# Patient Record
Sex: Male | Born: 1990 | Race: White | Hispanic: No | Marital: Single | Smoking: Never smoker
Health system: Southern US, Community
[De-identification: ages and names within clinical notes are randomized; demographics above are authoritative.]

## PROBLEM LIST (undated history)

## (undated) DIAGNOSIS — K219 Gastro-esophageal reflux disease without esophagitis: Secondary | ICD-10-CM

## (undated) DIAGNOSIS — T7840XA Allergy, unspecified, initial encounter: Secondary | ICD-10-CM

## (undated) HISTORY — DX: Gastro-esophageal reflux disease without esophagitis: K21.9

## (undated) HISTORY — PX: LAPAROSCOPIC ENDO-RECTAL PULL THROUGH FOR HIRSCHSPRUNG'S DISEASE: SHX1923

## (undated) HISTORY — DX: Allergy, unspecified, initial encounter: T78.40XA

---
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
## 2015-07-16 ENCOUNTER — Emergency Department: Payer: BLUE CROSS/BLUE SHIELD

## 2015-07-16 ENCOUNTER — Encounter: Payer: Self-pay | Admitting: Emergency Medicine

## 2015-07-16 ENCOUNTER — Emergency Department
Admission: EM | Admit: 2015-07-16 | Discharge: 2015-07-16 | Disposition: A | Discharge status: Home / Self Care | Payer: BLUE CROSS/BLUE SHIELD | Attending: Emergency Medicine | Admitting: Emergency Medicine

## 2015-07-16 DIAGNOSIS — S0990XA Unspecified injury of head, initial encounter: Secondary | ICD-10-CM | POA: Insufficient documentation

## 2015-07-16 DIAGNOSIS — Y9389 Activity, other specified: Secondary | ICD-10-CM | POA: Insufficient documentation

## 2015-07-16 DIAGNOSIS — Y9289 Other specified places as the place of occurrence of the external cause: Secondary | ICD-10-CM | POA: Insufficient documentation

## 2015-07-16 DIAGNOSIS — S01112A Laceration without foreign body of left eyelid and periocular area, initial encounter: Secondary | ICD-10-CM | POA: Diagnosis not present

## 2015-07-16 DIAGNOSIS — S022XXA Fracture of nasal bones, initial encounter for closed fracture: Secondary | ICD-10-CM | POA: Insufficient documentation

## 2015-07-16 DIAGNOSIS — S0993XA Unspecified injury of face, initial encounter: Secondary | ICD-10-CM | POA: Diagnosis present

## 2015-07-16 DIAGNOSIS — W108XXA Fall (on) (from) other stairs and steps, initial encounter: Secondary | ICD-10-CM | POA: Insufficient documentation

## 2015-07-16 DIAGNOSIS — S01111A Laceration without foreign body of right eyelid and periocular area, initial encounter: Secondary | ICD-10-CM | POA: Diagnosis not present

## 2015-07-16 DIAGNOSIS — Y998 Other external cause status: Secondary | ICD-10-CM | POA: Diagnosis not present

## 2015-07-16 MED ORDER — LIDOCAINE-EPINEPHRINE (PF) 1 %-1:200000 IJ SOLN
INTRAMUSCULAR | Status: AC
  Filled 2015-07-16: qty 30

## 2015-07-16 MED ORDER — BACITRACIN ZINC 500 UNIT/GM EX OINT
TOPICAL_OINTMENT | CUTANEOUS | Status: AC
  Administered 2015-07-16: 1 via TOPICAL
  Filled 2015-07-16: qty 0.9

## 2015-07-16 MED ORDER — LIDOCAINE-EPINEPHRINE 2 %-1:100000 IJ SOLN
30.0000 mL | Freq: Once | INTRAMUSCULAR | Status: DC
  Filled 2015-07-16: qty 30

## 2015-07-16 MED ORDER — BACITRACIN ZINC 500 UNIT/GM EX OINT
TOPICAL_OINTMENT | Freq: Once | CUTANEOUS | Status: AC
  Administered 2015-07-16: 1 via TOPICAL

## 2015-07-16 NOTE — ED Notes (Signed)
Pt. Returned from CT.

## 2015-07-16 NOTE — ED Notes (Signed)
Pt. Going home with mother. 

## 2015-07-16 NOTE — Discharge Instructions (Signed)
Concussion, Adult  A concussion, or closed-head injury, is a brain injury caused by a direct blow to the head or by a quick and sudden movement (jolt) of the head or neck. Concussions are usually not life-threatening. Even so, the effects of a concussion can be serious. If you have had a concussion before, you are more likely to experience concussion-like symptoms after a direct blow to the head.   CAUSES  · Direct blow to the head, such as from running into another player during a soccer game, being hit in a fight, or hitting your head on a hard surface.  · A jolt of the head or neck that causes the brain to move back and forth inside the skull, such as in a car crash.  SIGNS AND SYMPTOMS  The signs of a concussion can be hard to notice. Early on, they may be missed by you, family members, and health care providers. You may look fine but act or feel differently.  Symptoms are usually temporary, but they may last for days, weeks, or even longer. Some symptoms may appear right away while others may not show up for hours or days. Every head injury is different. Symptoms include:  · Mild to moderate headaches that will not go away.  · A feeling of pressure inside your head.  · Having more trouble than usual:    Learning or remembering things you have heard.    Answering questions.    Paying attention or concentrating.    Organizing daily tasks.    Making decisions and solving problems.  · Slowness in thinking, acting or reacting, speaking, or reading.  · Getting lost or being easily confused.  · Feeling tired all the time or lacking energy (fatigued).  · Feeling drowsy.  · Sleep disturbances.    Sleeping more than usual.    Sleeping less than usual.    Trouble falling asleep.    Trouble sleeping (insomnia).  · Loss of balance or feeling lightheaded or dizzy.  · Nausea or vomiting.  · Numbness or tingling.  · Increased sensitivity to:    Sounds.    Lights.    Distractions.  · Vision problems or eyes that tire  easily.  · Diminished sense of taste or smell.  · Ringing in the ears.  · Mood changes such as feeling sad or anxious.  · Becoming easily irritated or angry for little or no reason.  · Lack of motivation.  · Seeing or hearing things other people do not see or hear (hallucinations).  DIAGNOSIS  Your health care provider can usually diagnose a concussion based on a description of your injury and symptoms. He or she will ask whether you passed out (lost consciousness) and whether you are having trouble remembering events that happened right before and during your injury.  Your evaluation might include:  · A brain scan to look for signs of injury to the brain. Even if the test shows no injury, you may still have a concussion.  · Blood tests to be sure other problems are not present.  TREATMENT  · Concussions are usually treated in an emergency department, in urgent care, or at a clinic. You may need to stay in the hospital overnight for further treatment.  · Tell your health care provider if you are taking any medicines, including prescription medicines, over-the-counter medicines, and natural remedies. Some medicines, such as blood thinners (anticoagulants) and aspirin, may increase the chance of complications. Also tell your health care   provider whether you have had alcohol or are taking illegal drugs. This information may affect treatment.  · Your health care provider will send you home with important instructions to follow.  · How fast you will recover from a concussion depends on many factors. These factors include how severe your concussion is, what part of your brain was injured, your age, and how healthy you were before the concussion.  · Most people with mild injuries recover fully. Recovery can take time. In general, recovery is slower in older persons. Also, persons who have had a concussion in the past or have other medical problems may find that it takes longer to recover from their current injury.  HOME  CARE INSTRUCTIONS  General Instructions  · Carefully follow the directions your health care provider gave you.  · Only take over-the-counter or prescription medicines for pain, discomfort, or fever as directed by your health care provider.  · Take only those medicines that your health care provider has approved.  · Do not drink alcohol until your health care provider says you are well enough to do so. Alcohol and certain other drugs may slow your recovery and can put you at risk of further injury.  · If it is harder than usual to remember things, write them down.  · If you are easily distracted, try to do one thing at a time. For example, do not try to watch TV while fixing dinner.  · Talk with family members or close friends when making important decisions.  · Keep all follow-up appointments. Repeated evaluation of your symptoms is recommended for your recovery.  · Watch your symptoms and tell others to do the same. Complications sometimes occur after a concussion. Older adults with a brain injury may have a higher risk of serious complications, such as a blood clot on the brain.  · Tell your teachers, school nurse, school counselor, coach, athletic trainer, or work manager about your injury, symptoms, and restrictions. Tell them about what you can or cannot do. They should watch for:    Increased problems with attention or concentration.    Increased difficulty remembering or learning new information.    Increased time needed to complete tasks or assignments.    Increased irritability or decreased ability to cope with stress.    Increased symptoms.  · Rest. Rest helps the brain to heal. Make sure you:    Get plenty of sleep at night. Avoid staying up late at night.    Keep the same bedtime hours on weekends and weekdays.    Rest during the day. Take daytime naps or rest breaks when you feel tired.  · Limit activities that require a lot of thought or concentration. These include:    Doing homework or job-related  work.    Watching TV.    Working on the computer.  · Avoid any situation where there is potential for another head injury (football, hockey, soccer, basketball, martial arts, downhill snow sports and horseback riding). Your condition will get worse every time you experience a concussion. You should avoid these activities until you are evaluated by the appropriate follow-up health care providers.  Returning To Your Regular Activities  You will need to return to your normal activities slowly, not all at once. You must give your body and brain enough time for recovery.  · Do not return to sports or other athletic activities until your health care provider tells you it is safe to do so.  · Ask   your health care provider when you can drive, ride a bicycle, or operate heavy machinery. Your ability to react may be slower after a brain injury. Never do these activities if you are dizzy.  · Ask your health care provider about when you can return to work or school.  Preventing Another Concussion  It is very important to avoid another brain injury, especially before you have recovered. In rare cases, another injury can lead to permanent brain damage, brain swelling, or death. The risk of this is greatest during the first 7-10 days after a head injury. Avoid injuries by:  · Wearing a seat belt when riding in a car.  · Drinking alcohol only in moderation.  · Wearing a helmet when biking, skiing, skateboarding, skating, or doing similar activities.  · Avoiding activities that could lead to a second concussion, such as contact or recreational sports, until your health care provider says it is okay.  · Taking safety measures in your home.    Remove clutter and tripping hazards from floors and stairways.    Use grab bars in bathrooms and handrails by stairs.    Place non-slip mats on floors and in bathtubs.    Improve lighting in dim areas.  SEEK MEDICAL CARE IF:  · You have increased problems paying attention or  concentrating.  · You have increased difficulty remembering or learning new information.  · You need more time to complete tasks or assignments than before.  · You have increased irritability or decreased ability to cope with stress.  · You have more symptoms than before.  Seek medical care if you have any of the following symptoms for more than 2 weeks after your injury:  · Lasting (chronic) headaches.  · Dizziness or balance problems.  · Nausea.  · Vision problems.  · Increased sensitivity to noise or light.  · Depression or mood swings.  · Anxiety or irritability.  · Memory problems.  · Difficulty concentrating or paying attention.  · Sleep problems.  · Feeling tired all the time.  SEEK IMMEDIATE MEDICAL CARE IF:  · You have severe or worsening headaches. These may be a sign of a blood clot in the brain.  · You have weakness (even if only in one hand, leg, or part of the face).  · You have numbness.  · You have decreased coordination.  · You vomit repeatedly.  · You have increased sleepiness.  · One pupil is larger than the other.  · You have convulsions.  · You have slurred speech.  · You have increased confusion. This may be a sign of a blood clot in the brain.  · You have increased restlessness, agitation, or irritability.  · You are unable to recognize people or places.  · You have neck pain.  · It is difficult to wake you up.  · You have unusual behavior changes.  · You lose consciousness.  MAKE SURE YOU:  · Understand these instructions.  · Will watch your condition.  · Will get help right away if you are not doing well or get worse.     This information is not intended to replace advice given to you by your health care provider. Make sure you discuss any questions you have with your health care provider.     Document Released: 08/17/2003 Document Revised: 06/17/2014 Document Reviewed: 12/17/2012  Elsevier Interactive Patient Education ©2016 [employer]

## 2015-07-16 NOTE — ED Notes (Signed)
Pt. States he fell going up 3 stairs.  Pt. States he has had some alcohol tonight.  Pt. Has 3 cm laceration above lt. Eye and a 2 cm laceration below lt. Eye.  Pt. Denies LOC.

## 2015-07-16 NOTE — ED Notes (Signed)
Pt says he tripped on the steps and fell down 3 stairs; not sure what he struck his head/face on; pt with laceration above left eye and laceration below left eye; swelling and bruising around left eye; pt admits to drinking about 6 beers tonight; pt awake and alert in triage; denies loss of consciousness

## 2015-07-16 NOTE — ED Notes (Signed)
Cleaned pt. Face with surgical scrub.

## 2015-07-16 NOTE — ED Provider Notes (Signed)
Time Seen: Approximately 320  I have reviewed the triage notes  Chief Complaint: Fall; Laceration; and Facial Injury   History of Present Illness: Chris Mcbride is a 25 y.o. male *who apparently tripped down some steps and admits to drinking several beers this evening. Patient struck his head and his face on the steps. There was no loss of consciousness before or after his fall. He denies any visual disturbances. He has a laceration at the left eyebrow and also underneath his left eye. Denies any neck thoracic or lumbar spine pain. He states his last tetanus shot was approximately 2 years ago.   History reviewed. No pertinent past medical history.  There are no active problems to display for this patient.   History reviewed. No pertinent past surgical history.  History reviewed. No pertinent past surgical history.  No current outpatient prescriptions on file.  Allergies:  Review of patient's allergies indicates no known allergies.  Family History: History reviewed. No pertinent family history.  Social History: Social History  Substance Use Topics  . Smoking status: Never Smoker   . Smokeless tobacco: None  . Alcohol Use: Yes     Review of Systems:   Constitutional: No fever Eyes: No visual disturbances ENT: No sore throat, ear pain Cardiac: No chest pain Respiratory: No shortness of breath, wheezing, or stridor Abdomen: No abdominal pain, no vomiting, No diarrhea Neurologic: No focal weakness, trouble with speech or swollowing He denies any extremity trauma Physical Exam:  ED Triage Vitals  Enc Vitals Group     BP 07/16/15 0300 134/74 mmHg     Pulse Rate 07/16/15 0300 119     Resp 07/16/15 0300 18     Temp 07/16/15 0300 97.6 F (36.4 C)     Temp Source 07/16/15 0300 Oral     SpO2 07/16/15 0300 97 %     Weight 07/16/15 0300 175 lb (79.379 kg)     Height 07/16/15 0300  (1.727 m)     Head Cir --      Peak Flow --      Pain Score 07/16/15 0302 2      Pain Loc --      Pain Edu? --      Excl. in GC? --     General: Awake , Alert , and Oriented times 3; GCS 15 Head: Normal cephalic , atraumatic midface is stable. He says the area of bruising left medial canthus area with a laceration right eyebrow and a laceration underneath the left eye. Eyes: Pupils equal , round, reactive to light the vision remained stable with all extraocular eye movements. No papilledema Nose/Throat: No nasal drainage, patent upper airway without erythema or exudate. Bruising at the nasal bridge. No septal hematoma Neck: Supple, Full range of motion, No anterior adenopathy or palpable thyroid masses Lungs: Clear to ascultation without wheezes , rhonchi, or rales Heart: Regular rate, regular rhythm without murmurs , gallops , or rubs Abdomen: Soft, non tender without rebound, guarding , or rigidity; bowel sounds positive and symmetric in all 4 quadrants. No organomegaly .        Extremities: 2 plus symmetric pulses. No edema, clubbing or cyanosis Neurologic: normal ambulation, Motor symmetric without deficits, sensory intact Skin: warm, dry, no rashes   Labs:   All laboratory work was reviewed including any pertinent negatives or positives listed below:  Labs Reviewed - No data to display   Radiology:    CLINICAL DATA: Tripped and fell down 3  steps, struck head and face. LEFT periorbital laceration.  EXAM: CT HEAD WITHOUT CONTRAST  CT MAXILLOFACIAL WITHOUT CONTRAST  TECHNIQUE: Multidetector CT imaging of the head and maxillofacial structures were performed using the standard protocol without intravenous contrast. Multiplanar CT image reconstructions of the maxillofacial structures were also generated.  COMPARISON: None.  FINDINGS: CT HEAD FINDINGS  The ventricles and sulci are normal. No intraparenchymal hemorrhage, mass effect nor midline shift. No acute large vascular territory infarcts. No abnormal extra-axial fluid collections. Basal  cisterns are patent. No skull fracture.  CT MAXILLOFACIAL FINDINGS  Comminuted bilateral nasal bone fractures, extending to the nasal process of the LEFT maxilla. Fracture fragments are mildly displaced to the RIGHT. Minimally displaced osseous nasal septum fracture, septum is deviated to the RIGHT. Mandible is intact, condyles are located and there are no additional facial fractures.  Small amount of LEFT maxillary hemo sinus. Mild paranasal sinus mucosal thickening. Imaged mastoid air cells are well aerated. No destructive bony lesions.  Ocular globes and orbital contents are normal. Frontal scalp hematoma, subcutaneous gas with midface soft tissue swelling extending to the LEFT pre malar soft tissues. No radiopaque foreign bodies.  IMPRESSION: CT HEAD: Normal noncontrast CT head.  CT MAXILLOFACIAL: Acute bilateral mildly displaced nasal bone fractures. Mildly displaced osseous nasal septum.  Frontal and midface soft tissue swelling without postseptal hematoma.   I personally reviewed the radiologic studies   Procedures:  LACERATION REPAIR Performed by: Jennye Moccasin Authorized by: Jennye Moccasin Consent: Verbal consent obtained. Risks and benefits: risks, benefits and alternatives were discussed Consent given by: patient Patient identity confirmed: provided demographic data Prepped and Draped in normal sterile fashion Wound explored  Laceration Location: Left eyebrow  Laceration Length: 3*cm  No Foreign Bodies seen or palpated  Anesthesia: local infiltration  Local anesthetic: lidocaine 1 % with epinephrine  Anesthetic total: 3 ml  Irrigation method: syringe Amount of cleaning: standard  Skin closure: Single-layer non-complicated   Number of sutures: 3 6-0 Prolene   Technique: *Interrupted   Patient tolerance: Patient tolerated the procedure well with no immediate complications.   C ED Course: * Patient's stay here was uneventful. His family  is here by the bedside and explained that he'll need ice over the next 48 hours to decrease the swelling. I also referred him to an ENT physician for his nasal fracture. Suture removal should be in 7 days. He has a small laceration below the eye 1 cm non-complicated just through the skin and was closed with Steri-Strip. He's been advised drink plenty of fluids and return here if he has any new concerns. They were given instructions on concussions, nasal fracture, and laceration repair instructions   Assessment: * Acute closed head injury Nasal fracture 3 cm single layer closure without complication      Plan: Patient was advised to return immediately if condition worsens. Patient was advised to follow up with their primary care physician or other specialized physicians involved in their outpatient care             Jennye Moccasin, MD 07/16/15 [phone]

## 2015-07-16 NOTE — ED Notes (Signed)
Patient transported to CT 

## 2016-10-30 IMAGING — CT CT MAXILLOFACIAL W/O CM
4 series · 16 of 47 positions shown, 18 images · non-contrast
Comparison: None.

CLINICAL DATA: Tripped and fell down 3 steps, struck head and face.
LEFT periorbital laceration.

EXAM:
CT HEAD WITHOUT CONTRAST
CT MAXILLOFACIAL WITHOUT CONTRAST
TECHNIQUE: Multidetector CT imaging of the head and maxillofacial structures
were performed using the standard protocol without intravenous
contrast. Multiplanar CT image reconstructions of the maxillofacial
structures were also generated.

[Series 2: head wo · axial · 0.47mm/px · z∈[-116,-21]mm · 6 of 30 slices shown, 8 images]
[im 5/30  brain]
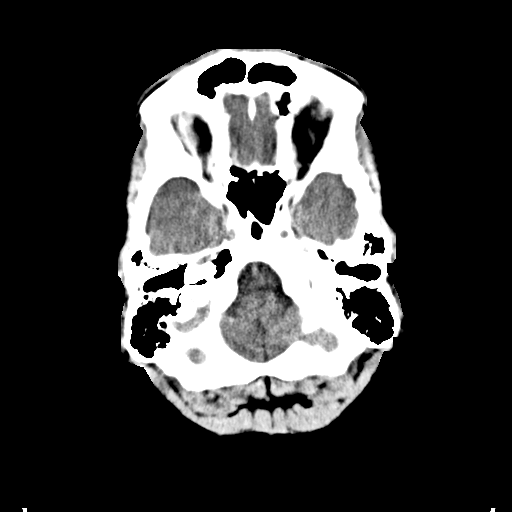
[im 5/30  bone]
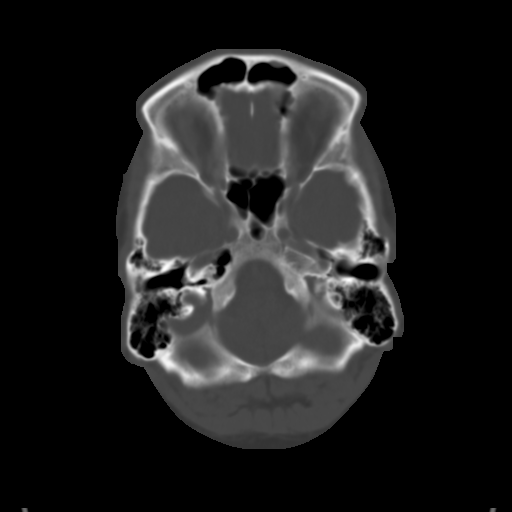
[im 9/30  bone]
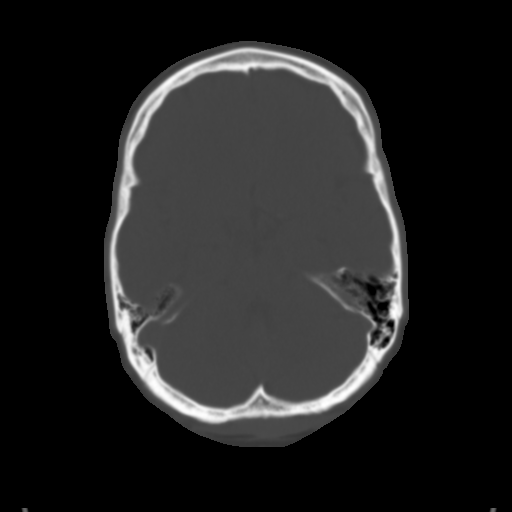
[im 13/30  bone]
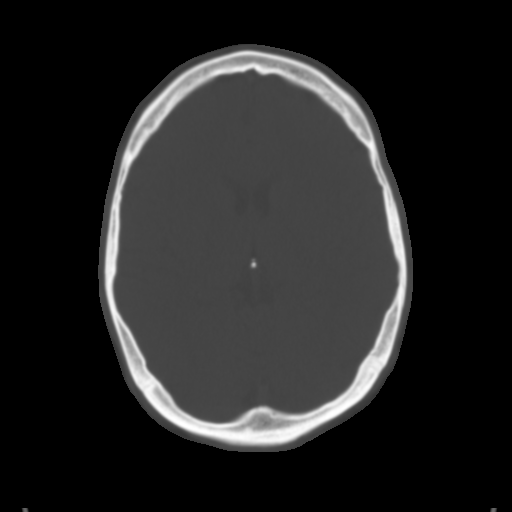
[im 17/30  bone]
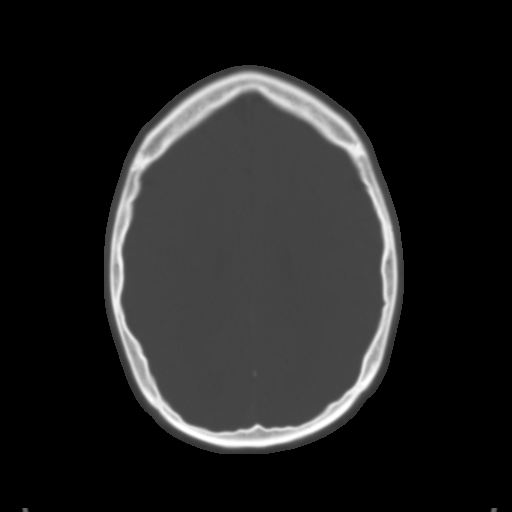
[im 21/30  brain]
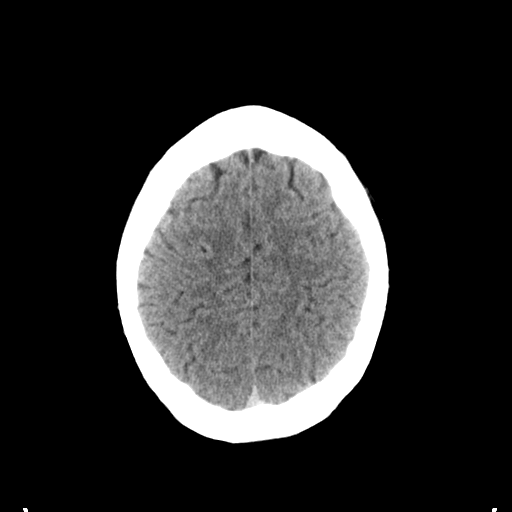
[im 21/30  bone]
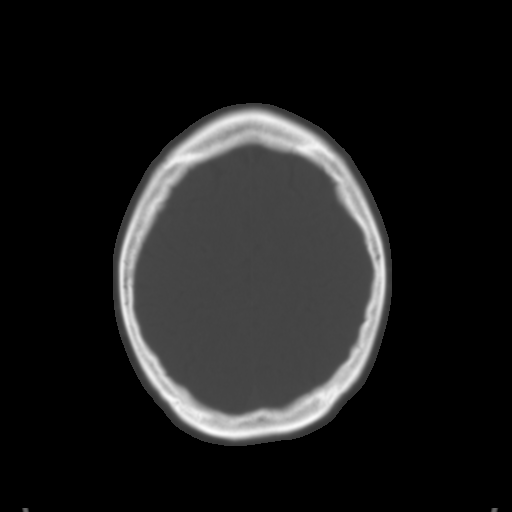
[im 25/30  bone]
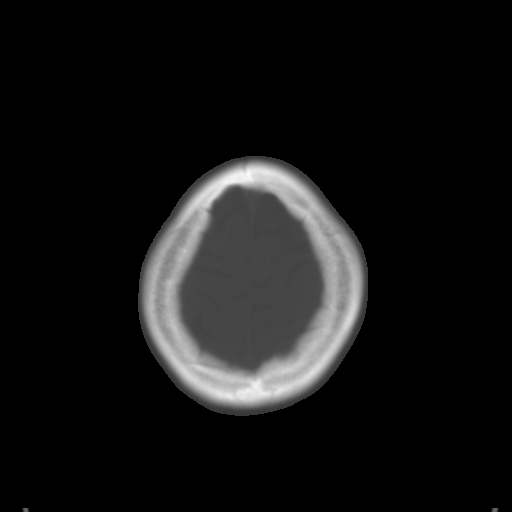

[Series 3: max soft · axial · 0.31mm/px · z∈[-242,-190]mm · 4 of 79 slices shown]
[im 8/79  brain]
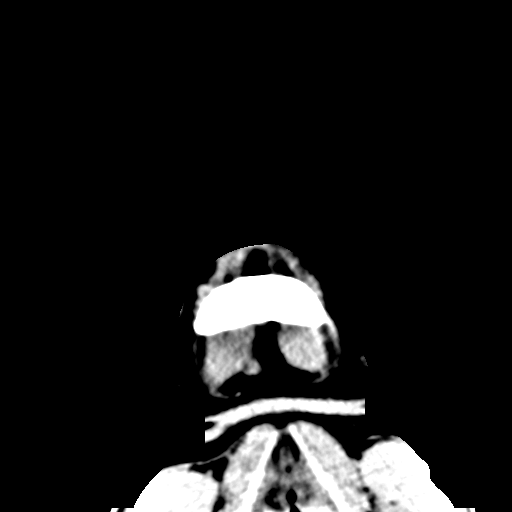
[im 15/79  brain]
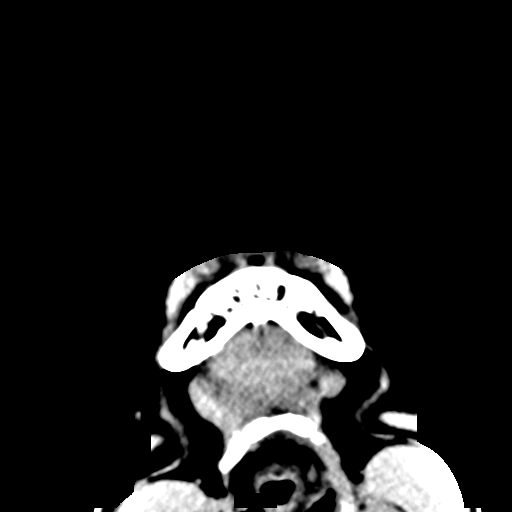
[im 27/79  brain]
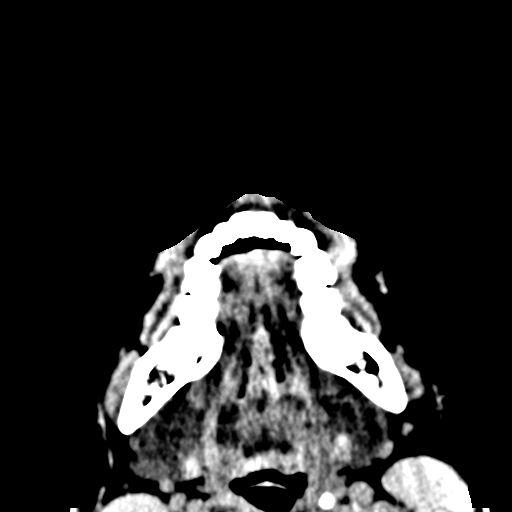
[im 34/79  brain]
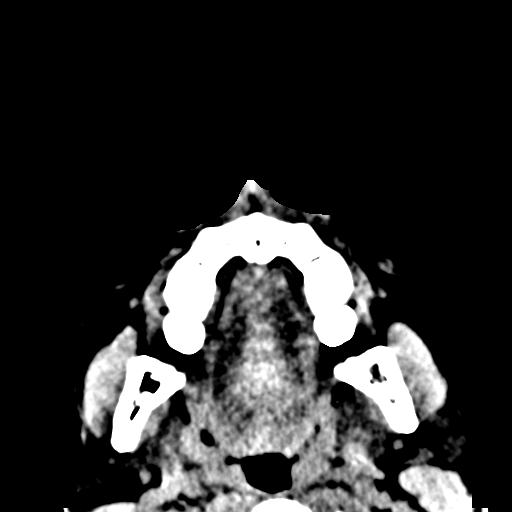

[Series 6: coronal soft · coronal · 0.32mm/px · 3 of 68 slices shown]
[im 23/68  bone]
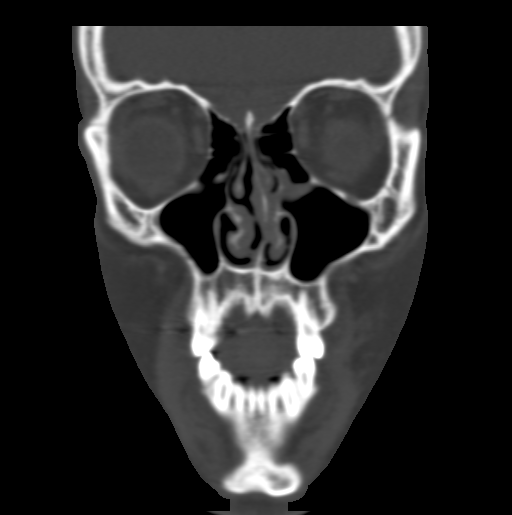
[im 30/68  bone]
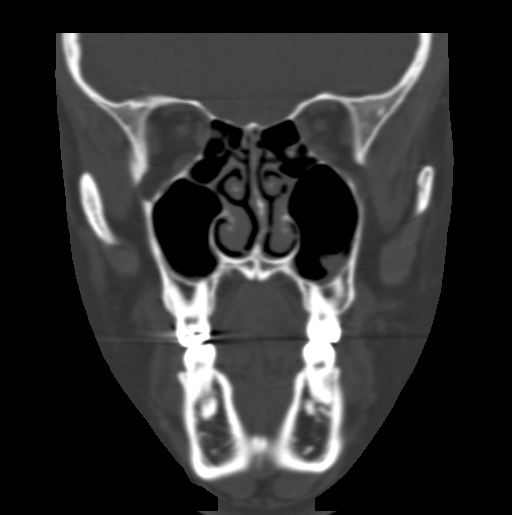
[im 38/68  bone]
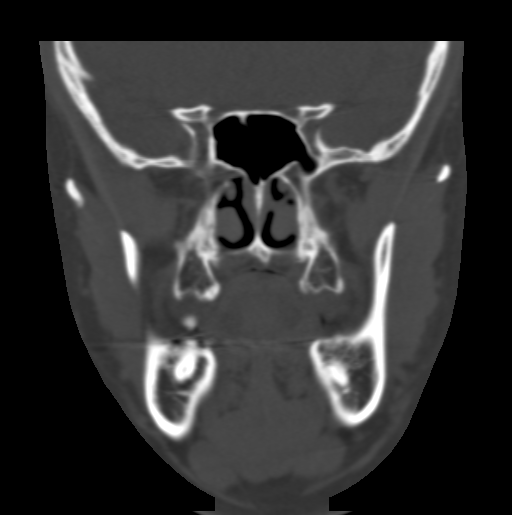

[Series 7: sagittal soft · sagittal · 0.32mm/px · 3 of 77 slices shown]
[im 26/77  bone]
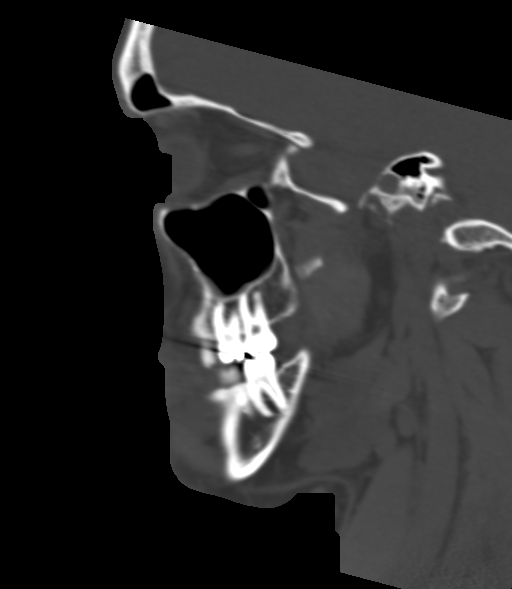
[im 39/77  bone]
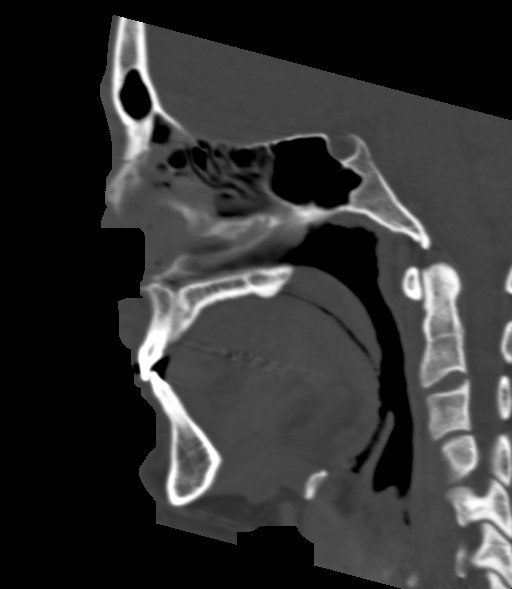
[im 51/77  bone]
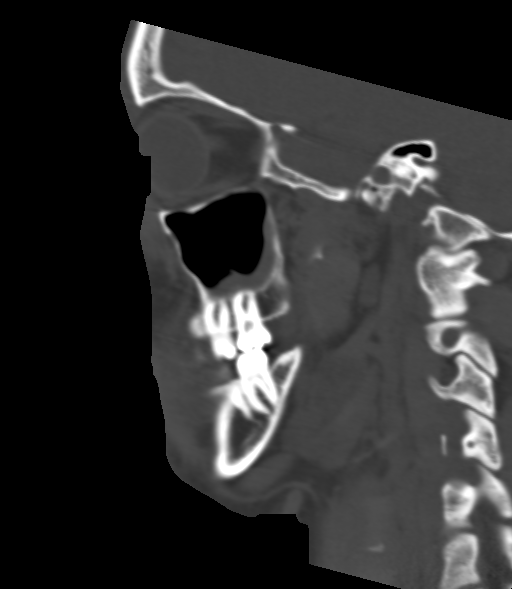

[16 of 47 positions shown; findings below may reference images not displayed]

FINDINGS: CT HEAD FINDINGS

The ventricles and sulci are normal. No intraparenchymal hemorrhage,
mass effect nor midline shift. No acute large vascular territory
infarcts. No abnormal extra-axial fluid collections. Basal cisterns
are patent. No skull fracture.

CT MAXILLOFACIAL FINDINGS

Comminuted bilateral nasal bone fractures, extending to the nasal
process of the LEFT maxilla. Fracture fragments are mildly displaced
to the RIGHT. Minimally displaced osseous nasal septum fracture,
septum is deviated to the RIGHT. Mandible is intact, condyles are
located and there are no additional facial fractures.

Small amount of LEFT maxillary hemo sinus. Mild paranasal sinus
mucosal thickening. Imaged mastoid air cells are well aerated. No
destructive bony lesions.

Ocular globes and orbital contents are normal. Frontal scalp
hematoma, subcutaneous gas with midface soft tissue swelling
extending to the LEFT pre malar soft tissues. No radiopaque foreign
bodies.
IMPRESSION: CT HEAD: Normal noncontrast CT head.

CT MAXILLOFACIAL: Acute bilateral mildly displaced nasal bone
fractures. Mildly displaced osseous nasal septum.

Frontal and midface soft tissue swelling without postseptal
hematoma.

## 2019-10-25 ENCOUNTER — Other Ambulatory Visit: Payer: Self-pay | Admitting: Family Medicine

## 2019-10-25 ENCOUNTER — Other Ambulatory Visit: Payer: Self-pay

## 2019-10-25 ENCOUNTER — Encounter: Payer: Self-pay | Admitting: Family Medicine

## 2019-10-25 ENCOUNTER — Ambulatory Visit (INDEPENDENT_AMBULATORY_CARE_PROVIDER_SITE_OTHER): Payer: BLUE CROSS/BLUE SHIELD | Admitting: Family Medicine

## 2019-10-25 VITALS — BP 105/68 | HR 52 | Temp 97.7°F | Resp 19 | Ht 68.0 in | Wt 150.0 lb

## 2019-10-25 DIAGNOSIS — R3915 Urgency of urination: Secondary | ICD-10-CM | POA: Insufficient documentation

## 2019-10-25 DIAGNOSIS — Z0000 Encounter for general adult medical examination without abnormal findings: Secondary | ICD-10-CM

## 2019-10-25 DIAGNOSIS — Z114 Encounter for screening for human immunodeficiency virus [HIV]: Secondary | ICD-10-CM

## 2019-10-25 DIAGNOSIS — R222 Localized swelling, mass and lump, trunk: Secondary | ICD-10-CM

## 2019-10-25 DIAGNOSIS — Z7689 Persons encountering health services in other specified circumstances: Secondary | ICD-10-CM | POA: Diagnosis not present

## 2019-10-25 DIAGNOSIS — J3089 Other allergic rhinitis: Secondary | ICD-10-CM | POA: Diagnosis not present

## 2019-10-25 DIAGNOSIS — J309 Allergic rhinitis, unspecified: Secondary | ICD-10-CM | POA: Insufficient documentation

## 2019-10-25 LAB — POCT URINALYSIS DIPSTICK
Bilirubin, UA: NEGATIVE
Blood, UA: NEGATIVE
Glucose, UA: NEGATIVE
Ketones, UA: NEGATIVE
Leukocytes, UA: NEGATIVE
Nitrite, UA: NEGATIVE
Protein, UA: NEGATIVE
Spec Grav, UA: 1.02 (ref 1.010–1.025)
Urobilinogen, UA: 0.2 E.U./dL
pH, UA: 5 (ref 5.0–8.0)

## 2019-10-25 NOTE — Patient Instructions (Addendum)
Thank you for coming to the office today.  Request records from [facility] and we can review them in the future.  Urinary Urgency - we will check urine test today and send out for culture. - Regardless, most likely we will be referring to a Urologist   Let me know which urologist you prefer - either our [facility] group in [facility] -1st floor [address] [place],  [place]  [address] Phone: [phone]  Or Dr Anola Gurney - at [facility]  -------------------------  For the bump/nodule on the chest - Most likely one of these options - Lipoma - Cyst - Lymph node  We can watch it for 1 more month, if still persistent or changing, then I would start with referral to General Surgery to evaluate / remove / and diagnose   DUE for FASTING BLOOD WORK (no food or drink after midnight before the lab appointment, only water or coffee without cream/sugar on the morning of)  SCHEDULE "Lab Only" visit in the morning at the clinic for lab draw in 4 WEEKS   - Make sure Lab Only appointment is at about 1 week before your next appointment, so that results will be available  For Lab Results, once available within 2-3 days of blood draw, you can can log in to MyChart online to view your results and a brief explanation. Also, we can discuss results at next follow-up visit.   Please schedule a Follow-up Appointment to: Return in about 4 weeks (around 11/22/2019) for Annual Physical.  If you have any other questions or concerns, please feel free to call the office or send a message through MyChart. You may also schedule an earlier appointment if necessary.  Additionally, you may be receiving a survey about your experience at our office within a few days to 1 week by e-mail or mail. We value your feedback.  Saralyn Pilar, DO [facility], [place]

## 2019-10-25 NOTE — Progress Notes (Signed)
Subjective:    Patient ID: Chris Mcbride, male    DOB: 1991-01-26, 29 y.o.   MRN: [id number]  Chris Mcbride is a 29 y.o. male presenting on 10/25/2019 for Establish Care, Urinary urge, and Nodule on chest   HPI   Here for establish care. He has 2 main concerns today see below He has prior records from PCP years ago, will get record He would like an annual physical in next few weeks or month with labs here to re-establish  Urinary Urge For past 1-2 months, has had increasing urinary urgency at times, rare leakage. Seems unpredictable, during day without having overnight episodes. 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 But he says the urge to urinate does not occur while waiting 3+hours to go urinate, it can be random. It can occur 1 hour after voiding or other times. - Admits occasional darker urine usually in morning only - Drinks a beer a day often after work - Occasionally 1 coffee per week. No sodas. Occasional Tea in evening. - Denies any hematuria, dysuria, pelvic or abdominal pain, foamy urine, foul odor  Nodule on Chest Wall Reports also for past 1-2 months approximately maybe longer, noticed a small nodule on left front of his chest near sternum. He said pushed on it and felt a bump, it was not sore. He has monitored it, and has not increased or decreased in size. It is mobile and he can feel it. No redness or rash or swelling or pain or itching. No rash on surface of skin. Denies any other swelling or nodules under arms or neck or other areas.  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 No further problem.  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 He has family history father with dx prostate cancer age 30-50  approximately, treated by Dr Evelene Croon, had additional specialized procedure, doing well.  Did not get a COVID19 vaccine   Depression screen [facility] 2/9 10/25/2019  Decreased Interest 0  Down, Depressed, Hopeless 0  PHQ - 2 Score 0    History reviewed. No pertinent past medical history. Past Surgical History:  Procedure Laterality Date  . LAPAROSCOPIC ENDO-RECTAL PULL THROUGH FOR HIRSCHSPRUNG'S DISEASE     Social History   Socioeconomic History  . Marital status: Single    Spouse name: Not on file  . Number of children: Not on file  . Years of education: [employer]  . Highest education level: High school graduate  Occupational History  . Occupation: Civil Service fast streamer  Tobacco Use  . Smoking status: Never Smoker  . Smokeless tobacco: Never Used  . Tobacco comment: Vape--from 3 years   Substance and Sexual Activity  . Alcohol use: Yes    Alcohol/week: 6.0 standard drinks    Types: 6 Cans of beer per week  . Drug use: Not Currently    Types: Marijuana  . Sexual activity: Not on file  Other Topics Concern  . Not on file  Social History Narrative  . Not on file   Social Determinants of Health   Financial Resource Strain:   . Difficulty of Paying Living Expenses:   Food Insecurity:   . Worried About Programme researcher, broadcasting/film/video in the Last Year:   . Barista in  the Last Year:   Transportation Needs:   . Film/video editor (Medical):   Marland Kitchen Lack of Transportation (Non-Medical):   Physical Activity:   . Days of Exercise per Week:   . Minutes of Exercise per Session:   Stress:   . Feeling of Stress :   Social Connections:   . Frequency of Communication with Friends and Family:   . Frequency of Social Gatherings with Friends and Family:   . Attends Religious Services:   . Active Member of Clubs or Organizations:   . Attends Archivist Meetings:   Marland Kitchen Marital Status:   Intimate Partner Violence:   . Fear of Current or Ex-Partner:   . Emotionally Abused:   Marland Kitchen  Physically Abused:   . Sexually Abused:    Family History  Problem Relation Age of Onset  . Cancer Mother        breast  . Breast cancer Mother   . Stroke Father   . Diabetes Father   . Cancer Father        prostate  . Prostate cancer Father   . Irregular heart beat Sister        heart monitor, palpitation, no clear diagnosis   Current Outpatient Medications on File Prior to Visit  Medication Sig  . loratadine (CLARITIN) 10 MG tablet Take 10 mg by mouth daily.   No current facility-administered medications on file prior to visit.    Review of Systems Per HPI unless specifically indicated above      Objective:    BP 105/68   Pulse (!) 52   Temp 97.7 F (36.5 C) (Temporal)   Resp 19   Ht 5\' 8"  (1.727 m)   Wt 150 lb (68 kg)   BMI 22.81 kg/m   Wt Readings from Last 3 Encounters:  10/25/19 150 lb (68 kg)  07/16/15 175 lb (79.4 kg)    Physical Exam Vitals and nursing note reviewed.  Constitutional:      General: He is not in acute distress.    Appearance: He is well-developed. He is not diaphoretic.     Comments: Well-appearing, comfortable, cooperative  HENT:     Head: Normocephalic and atraumatic.  Eyes:     General:        Right eye: No discharge.        Left eye: No discharge.     Conjunctiva/sclera: Conjunctivae normal.  Cardiovascular:     Rate and Rhythm: Normal rate and regular rhythm.     Pulses: Normal pulses.     Heart sounds: No murmur.  Pulmonary:     Effort: Pulmonary effort is normal.     Breath sounds: Normal breath sounds. No wheezing or rales.  Chest:     Chest wall: No tenderness.  Skin:    General: Skin is warm and dry.     Findings: No erythema or rash.     Comments: Left anterior chest wall mobile firm 1 cm lesion subcutaneous, non tender, no other lesion or erythema, near approximately 3rd to 4th ICS space area near sternum  Neurological:     Mental Status: He is alert and oriented to person, place, and time.  Psychiatric:         Behavior: Behavior normal.     Comments: Well groomed, good eye contact, normal speech and thoughts    Results for orders placed or performed in visit on 10/25/19  POCT urinalysis dipstick  Result Value Ref Range   Color, UA  Insurance risk surveyor, UA clear    Glucose, UA Negative Negative   Bilirubin, UA Negative    Ketones, UA Negative    Spec Grav, UA 1.020 1.010 - 1.025   Blood, UA Negative    pH, UA 5.0 5.0 - 8.0   Protein, UA Negative Negative   Urobilinogen, UA 0.2 0.2 or 1.0 E.U./dL   Nitrite, UA Negative    Leukocytes, UA Negative Negative   Appearance     Odor        Assessment & Plan:   Problem List Items Addressed This Visit    Urinary urgency   Relevant Orders   POCT urinalysis dipstick (Completed)   Urine Culture   Nodule of left anterior chest wall   Environmental and seasonal allergies   Allergic rhinitis due to allergen - Primary    Other Visit Diagnoses    Encounter to establish care with new doctor          Request [facility] from [facility] PCP > 6+ years ago  #Allergies, seasonal / environmental Chronic problems, mostly controlled on therapy Continue OTC anti histamine daily loratadine Order rx - Start nasal steroid Flonase 2 sprays in each nostril daily for 4-6 weeks, may repeat course seasonally or as needed  #urinary urgency Unusual symptom today, does not seem precisely related to a UTI, given lack of history supporting this, however we can investigate further to rule out this and other causes with UA Dipstick and Urine Culture today - Consider due to bladder irritants - but limited dietary history of this, still possible, also concern with driving job he doesn't often stop to urinate however he denies going long hours without voiding, so it seems less likely again there. - he does have fam history of prostate cancer in father at younger age 85-50 ish approximately  I advised him that I would recommend pursuing Urology referral for  consultation on his urgency symptom, especially if his urine testing here comes back negative and I do not have an obvious cause to explain it. He will consider Dr Alcide [facility] (provider that saw his Father) or through [facility] provider at [facility] - he will notify us to send referral when ready, or when we call him with these curernt results first within 48-72 hours.    #Nodule of chest wall, anterior L Uncertain clear diagnosis, onset 1-2 months ago but questionable could be longer Exam today is suggestive most of benign etiology mobile firm nodular density, based on location seems most likely cystic lesion vs lipoma but cannot rule out lymph node, no other areas of nodular density, negative neck and axillary clavicular regions. No other systemic symptoms concerning or other clues. - We agree to defer this for monitoring for 1 more month, if still present then at return, we can pursue with consultation from general surgery, may consider Chest X-ray or we can refer and they can evaluate it and consider excisional biopsy if indicated.  Orders Placed This Encounter  Procedures  . Urine Culture  . POCT urinalysis dipstick     No orders of the defined types were placed in this encounter.    Follow up plan: Return in about 4 weeks (around 11/22/2019) for Annual Physical.   Future labs ordered for 11/15/19  Saralyn Pilar, DO [facility] 10/25/2019, 11:13 AM

## 2019-10-26 LAB — URINE CULTURE
MICRO NUMBER:: 10486074
Result:: NO GROWTH
SPECIMEN QUALITY:: ADEQUATE

## 2019-11-15 ENCOUNTER — Other Ambulatory Visit: Payer: PRIVATE HEALTH INSURANCE

## 2019-11-15 ENCOUNTER — Other Ambulatory Visit: Payer: Self-pay

## 2019-11-15 DIAGNOSIS — Z0000 Encounter for general adult medical examination without abnormal findings: Secondary | ICD-10-CM

## 2019-11-15 DIAGNOSIS — Z114 Encounter for screening for human immunodeficiency virus [HIV]: Secondary | ICD-10-CM

## 2019-11-16 LAB — LIPID PANEL
Cholesterol: 183 mg/dL (ref <200)
HDL: 68 mg/dL (ref >=40)
LDL Cholesterol (Calc): 88 mg/dL (calc)
Non-HDL Cholesterol (Calc): 115 mg/dL (calc) (ref <130)
Total CHOL/HDL Ratio: 2.7 (calc) (ref <5.0)
Triglycerides: 172 mg/dL — ABNORMAL HIGH (ref <150)

## 2019-11-16 LAB — CBC WITH DIFFERENTIAL/PLATELET
Absolute Monocytes: 385 cells/uL (ref 200–950)
Basophils Absolute: 42 cells/uL (ref 0–200)
Basophils Relative: 0.8 %
Eosinophils Absolute: 73 cells/uL (ref 15–500)
Eosinophils Relative: 1.4 %
HCT: 45.5 % (ref 38.5–50.0)
Hemoglobin: 14.8 g/dL (ref 13.2–17.1)
Lymphs Abs: 1024 cells/uL (ref 850–3900)
MCH: 30.6 pg (ref 27.0–33.0)
MCHC: 32.5 g/dL (ref 32.0–36.0)
MCV: 94.2 fL (ref 80.0–100.0)
MPV: 10.3 fL (ref 7.5–12.5)
Monocytes Relative: 7.4 %
Neutro Abs: 3676 cells/uL (ref 1500–7800)
Neutrophils Relative %: 70.7 %
Platelets: 184 10*3/uL (ref 140–400)
RBC: 4.83 10*6/uL (ref 4.20–5.80)
RDW: 12.2 % (ref 11.0–15.0)
Total Lymphocyte: 19.7 %
WBC: 5.2 10*3/uL (ref 3.8–10.8)

## 2019-11-16 LAB — COMPLETE METABOLIC PANEL WITH GFR
AG Ratio: 2.3 (calc) (ref 1.0–2.5)
ALT: 17 U/L (ref 9–46)
AST: 12 U/L (ref 10–40)
Albumin: 4.3 g/dL (ref 3.6–5.1)
Alkaline phosphatase (APISO): 51 U/L (ref 36–130)
BUN: 16 mg/dL (ref 7–25)
CO2: 27 mmol/L (ref 20–32)
Calcium: 9.1 mg/dL (ref 8.6–10.3)
Chloride: 107 mmol/L (ref 98–110)
Creat: 0.91 mg/dL (ref 0.60–1.35)
GFR, Est African American: 134 mL/min/{1.73_m2} (ref >=60)
GFR, Est Non African American: 116 mL/min/{1.73_m2} (ref >=60)
Globulin: 1.9 g/dL (calc) (ref 1.9–3.7)
Glucose, Bld: 103 mg/dL — ABNORMAL HIGH (ref 65–99)
Potassium: 4.5 mmol/L (ref 3.5–5.3)
Sodium: 140 mmol/L (ref 135–146)
Total Bilirubin: 0.2 mg/dL (ref 0.2–1.2)
Total Protein: 6.2 g/dL (ref 6.1–8.1)

## 2019-11-16 LAB — HEMOGLOBIN A1C
Hgb A1c MFr Bld: 4.9 % of total Hgb (ref <5.7)
Mean Plasma Glucose: 94 (calc)
eAG (mmol/L): 5.2 (calc)

## 2019-11-16 LAB — HIV ANTIBODY (ROUTINE TESTING W REFLEX): HIV 1&2 Ab, 4th Generation: NONREACTIVE

## 2019-11-22 ENCOUNTER — Encounter: Payer: PRIVATE HEALTH INSURANCE | Admitting: Family Medicine

## 2019-11-29 ENCOUNTER — Other Ambulatory Visit: Payer: Self-pay | Admitting: Family Medicine

## 2019-11-29 ENCOUNTER — Ambulatory Visit (INDEPENDENT_AMBULATORY_CARE_PROVIDER_SITE_OTHER): Payer: BLUE CROSS/BLUE SHIELD | Admitting: Family Medicine

## 2019-11-29 ENCOUNTER — Encounter: Payer: Self-pay | Admitting: Family Medicine

## 2019-11-29 ENCOUNTER — Other Ambulatory Visit: Payer: Self-pay

## 2019-11-29 VITALS — BP 102/64 | HR 62 | Temp 97.7°F | Resp 16 | Ht 68.0 in | Wt 158.6 lb

## 2019-11-29 DIAGNOSIS — E781 Pure hyperglyceridemia: Secondary | ICD-10-CM

## 2019-11-29 DIAGNOSIS — R7309 Other abnormal glucose: Secondary | ICD-10-CM

## 2019-11-29 DIAGNOSIS — Z0000 Encounter for general adult medical examination without abnormal findings: Secondary | ICD-10-CM

## 2019-11-29 DIAGNOSIS — R7301 Impaired fasting glucose: Secondary | ICD-10-CM | POA: Diagnosis not present

## 2019-11-29 DIAGNOSIS — R222 Localized swelling, mass and lump, trunk: Secondary | ICD-10-CM

## 2019-11-29 DIAGNOSIS — Z1159 Encounter for screening for other viral diseases: Secondary | ICD-10-CM

## 2019-11-29 NOTE — Patient Instructions (Addendum)
Thank you for coming to the office today.  1. Chemistry - Normal results, including electrolytes, kidney and liver function. Slightly elevated fasting sugar 103  2. Hemoglobin A1c (Diabetes screening) - 4.9, normal not in range of Pre-Diabetes (>5.7 to 6.4)   3. Routine screening HIV - Negative.  4. Cholesterol - Mild elevated Triglycerides 172, otherwise normal.  5. CBC Blood Counts - Normal, no anemia, other abnormality   DUE for FASTING BLOOD WORK (no food or drink after midnight before the lab appointment, only water or coffee without cream/sugar on the morning of)  SCHEDULE "Lab Only" visit in the morning at the clinic for lab draw in 1 YEAR  - Make sure Lab Only appointment is at about 1 week before your next appointment, so that results will be available  For Lab Results, once available within 2-3 days of blood draw, you can can log in to MyChart online to view your results and a brief explanation. Also, we can discuss results at next follow-up visit.   Please schedule a Follow-up Appointment to: Return in about 1 year (around 11/28/2020) for Annual Physical.  If you have any other questions or concerns, please feel free to call the office or send a message through MyChart. You may also schedule an earlier appointment if necessary.  Additionally, you may be receiving a survey about your experience at our office within a few days to 1 week by e-mail or mail. We value your feedback.  Saralyn Pilar, DO [facility], [place]

## 2019-11-29 NOTE — Progress Notes (Signed)
Subjective:    Patient ID: Chris Mcbride, male    DOB: 1991/04/23, 29 y.o.   MRN: [id number]  Chris Mcbride is a 29 y.o. male presenting on 11/29/2019 for Annual Exam   HPI   Here for Annual Physical and Lab Review.  Wellness / Lifestyle - He is doing well. No new concerns since last visit. His urination has mostly improved no new concerns. - Hypertriglyceridemia - elevated TG >170 on last lab, fasting, glucose 103 impaired slightly. Fam history T2DM - Diet: Balanced, meats proteins, starches, goal to improve veggie portions, limited breakfast, with protein shake in AM, light lunch, and main meal is dinner. - Exercise: Previously active at gym 4-5 days a week. 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   PMH Nodule on Chest Wall See last note, unchanged today now 3 months, persistent moderately sized nodule on chest wall, non tender. No red or rash. Denies any other swelling or nodules under arms or neck or other areas.  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 No further problem.  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 He has family history father with dx prostate cancer age 38-50 approximately, treated by Dr Yves Dill, had additional specialized procedure, doing well. - he has not decided to pursue any Urology yet.  Did not get a COVID19 vaccine   Depression screen [facility] 2/9 11/29/2019 10/25/2019  Decreased Interest 0 0  Down, Depressed, Hopeless 0 0  PHQ - 2 Score 0 0    No past medical history on file. Past Surgical History:  Procedure Laterality Date  . LAPAROSCOPIC ENDO-RECTAL PULL THROUGH FOR HIRSCHSPRUNG'S DISEASE     Social History   Socioeconomic History  . Marital status: Single    Spouse name: Not on file  . Number of children:  Not on file  . Years of education: [employer]  . Highest education level: High school graduate  Occupational History  . Occupation: Education officer, community  Tobacco Use  . Smoking status: Never Smoker  . Smokeless tobacco: Never Used  . Tobacco comment: Vape--from 3 years   Vaping Use  . Vaping Use: Every day  . Start date: 10/08/2016  Substance and Sexual Activity  . Alcohol use: Yes    Alcohol/week: 6.0 standard drinks    Types: 6 Cans of beer per week  . Drug use: Not Currently    Types: Marijuana  . Sexual activity: Not on file  Other Topics Concern  . Not on file  Social History Narrative  . Not on file   Social Determinants of Health   Financial Resource Strain:   . Difficulty of Paying Living Expenses:   Food Insecurity:   . Worried About Charity fundraiser in the Last Year:   . Arboriculturist in the Last Year:   Transportation Needs:   . Film/video editor (Medical):   Marland Kitchen Lack of Transportation (Non-Medical):   Physical Activity:   . Days of Exercise per Week:   . Minutes of Exercise per Session:   Stress:   . Feeling of Stress :   Social Connections:   . Frequency of Communication with Friends and Family:   . Frequency of Social Gatherings with Friends and Family:   . Attends Religious Services:   .  Active Member of Clubs or Organizations:   . Attends Banker Meetings:   Marland Kitchen Marital Status:   Intimate Partner Violence:   . Fear of Current or Ex-Partner:   . Emotionally Abused:   Marland Kitchen Physically Abused:   . Sexually Abused:    Family History  Problem Relation Age of Onset  . Cancer Mother        breast  . Breast cancer Mother   . Stroke Father   . Diabetes Father   . Cancer Father        prostate  . Prostate cancer Father   . Irregular heart beat Sister        heart monitor, palpitation, no clear diagnosis   Current Outpatient Medications on File Prior to Visit  Medication Sig  . loratadine (CLARITIN) 10 MG tablet Take 10 mg by mouth  daily.   No current facility-administered medications on file prior to visit.    Review of Systems  Constitutional: Negative for activity change, appetite change, chills, diaphoresis, fatigue and fever.  HENT: Negative for congestion and hearing loss.   Eyes: Negative for visual disturbance.  Respiratory: Negative for apnea, cough, chest tightness, shortness of breath and wheezing.   Cardiovascular: Negative for chest pain, palpitations and leg swelling.  Gastrointestinal: Negative for abdominal pain, anal bleeding, blood in stool, constipation, diarrhea, nausea and vomiting.  Endocrine: Negative for cold intolerance.  Genitourinary: Negative for frequency and hematuria.  Musculoskeletal: Negative for arthralgias, back pain and neck pain.  Skin: Negative for rash.  Allergic/Immunologic: Negative for environmental allergies.  Neurological: Negative for dizziness, weakness, light-headedness, numbness and headaches.  Hematological: Negative for adenopathy.  Psychiatric/Behavioral: Negative for behavioral problems, dysphoric mood and sleep disturbance. The patient is not nervous/anxious.    Per HPI unless specifically indicated above      Objective:    BP 102/64 (BP Location: Left Arm, Patient Position: Sitting, Cuff Size: Normal)   Pulse 62   Temp 97.7 F (36.5 C) (Temporal)   Resp 16   Ht 5\' 8"  (1.727 m)   Wt 158 lb 9.6 oz (71.9 kg)   SpO2 100%   BMI 24.12 kg/m   Wt Readings from Last 3 Encounters:  11/29/19 158 lb 9.6 oz (71.9 kg)  10/25/19 150 lb (68 kg)  07/16/15 175 lb (79.4 kg)    Physical Exam Vitals and nursing note reviewed.  Constitutional:      General: He is not in acute distress.    Appearance: He is well-developed. He is not diaphoretic.     Comments: Well-appearing, comfortable, cooperative  HENT:     Head: Normocephalic and atraumatic.  Eyes:     General:        Right eye: No discharge.        Left eye: No discharge.     Conjunctiva/sclera:  Conjunctivae normal.     Pupils: Pupils are equal, round, and reactive to light.  Neck:     Thyroid: No thyromegaly.     Vascular: No carotid bruit.  Cardiovascular:     Rate and Rhythm: Normal rate and regular rhythm.     Heart sounds: Normal heart sounds. No murmur heard.   Pulmonary:     Effort: Pulmonary effort is normal. No respiratory distress.     Breath sounds: Normal breath sounds. No wheezing or rales.  Abdominal:     General: Bowel sounds are normal. There is no distension.     Palpations: Abdomen is soft. There is no mass.  Tenderness: There is no abdominal tenderness.  Musculoskeletal:        General: No tenderness. Normal range of motion.     Cervical back: Normal range of motion and neck supple.     Comments: Upper / Lower Extremities: - Normal muscle tone, strength bilateral upper extremities 5/5, lower extremities 5/5  Lymphadenopathy:     Head:     Right side of head: No preauricular or occipital adenopathy.     Left side of head: No preauricular or occipital adenopathy.     Cervical: No cervical adenopathy.     Right cervical: No superficial or posterior cervical adenopathy.    Left cervical: No superficial or posterior cervical adenopathy.     Upper Body:     Right upper body: No supraclavicular or axillary adenopathy.     Left upper body: No supraclavicular or axillary adenopathy.  Skin:    General: Skin is warm and dry.     Findings: No erythema or rash.     Comments: Unchanged - Left anterior chest wall mobile firm 1 cm lesion subcutaneous, non tender, no other lesion or erythema, near approximately 3rd to 4th ICS space area near sternum   Neurological:     Mental Status: He is alert and oriented to person, place, and time.     Comments: Distal sensation intact to light touch all extremities  Psychiatric:        Behavior: Behavior normal.     Comments: Well groomed, good eye contact, normal speech and thoughts    Results for orders placed or  performed in visit on 11/15/19  HIV Antibody (routine testing w rflx)  Result Value Ref Range   HIV 1&2 Ab, 4th Generation NON-REACTIVE NON-REACTI  Lipid panel  Result Value Ref Range   Cholesterol 183 <200 mg/dL   HDL 68 > OR = 40 mg/dL   Triglycerides 009 (H) <150 mg/dL   LDL Cholesterol (Calc) 88 mg/dL (calc)   Total CHOL/HDL Ratio 2.7 <5.0 (calc)   Non-HDL Cholesterol (Calc) 115 <130 mg/dL (calc)  COMPLETE METABOLIC PANEL WITH GFR  Result Value Ref Range   Glucose, Bld 103 (H) 65 - 99 mg/dL   BUN 16 7 - 25 mg/dL   Creat 2.33 0.07 - 6.22 mg/dL   GFR, Est Non African American 116 > OR = 60 mL/min/1.8m2   GFR, Est African American 134 > OR = 60 mL/min/1.51m2   BUN/Creatinine Ratio NOT APPLICABLE 6 - 22 (calc)   Sodium 140 135 - 146 mmol/L   Potassium 4.5 3.5 - 5.3 mmol/L   Chloride 107 98 - 110 mmol/L   CO2 27 20 - 32 mmol/L   Calcium 9.1 8.6 - 10.3 mg/dL   Total Protein 6.2 6.1 - 8.1 g/dL   Albumin 4.3 3.6 - 5.1 g/dL   Globulin 1.9 1.9 - 3.7 g/dL (calc)   AG Ratio 2.3 1.0 - 2.5 (calc)   Total Bilirubin 0.2 0.2 - 1.2 mg/dL   Alkaline phosphatase (APISO) 51 36 - 130 U/L   AST 12 10 - 40 U/L   ALT 17 9 - 46 U/L  CBC with Differential/Platelet  Result Value Ref Range   WBC 5.2 3.8 - 10.8 Thousand/uL   RBC 4.83 4.20 - 5.80 Million/uL   Hemoglobin 14.8 13.2 - 17.1 g/dL   HCT 63.3 38 - 50 %   MCV 94.2 80.0 - 100.0 fL   MCH 30.6 27.0 - 33.0 pg   MCHC 32.5 32.0 - 36.0 g/dL  RDW 12.2 11.0 - 15.0 %   Platelets 184 140 - 400 Thousand/uL   MPV 10.3 7.5 - 12.5 fL   Neutro Abs 3,676 1,500 - 7,800 cells/uL   Lymphs Abs 1,024 850 - 3,900 cells/uL   Absolute Monocytes 385 200 - 950 cells/uL   Eosinophils Absolute 73 15 - 500 cells/uL   Basophils Absolute 42 0 - 200 cells/uL   Neutrophils Relative % 70.7 %   Total Lymphocyte 19.7 %   Monocytes Relative 7.4 %   Eosinophils Relative 1.4 %   Basophils Relative 0.8 %  Hemoglobin A1c  Result Value Ref Range   Hgb A1c MFr Bld 4.9  <5.7 % of total Hgb   Mean Plasma Glucose 94 (calc)   eAG (mmol/L) 5.2 (calc)      Assessment & Plan:   Problem List Items Addressed This Visit    Hypertriglyceridemia    Other Visit Diagnoses    Annual physical exam    -  Primary   Impaired fasting glucose          Updated Health Maintenance information - Reviewed fam history prostate CA. Not due for early PSA screening. He had prior other urinary symptoms but now resolved and not ready to pursue Urology consultation Reviewed recent lab results with patient Encouraged improvement to lifestyle with diet and exercise Fam history of type 2 diabetes, his goal is to maintain lifestyle to prevent sugars Maintain healthy weight  #L nodule chest Unchanged 4 weeks later, now about 3+ months, questionable etiology. He will observe this and let us know when ready for consultation with gen surgery  No orders of the defined types were placed in this encounter.   Follow up plan: Return in about 1 year (around 11/28/2020) for Annual Physical.   Orders for 11/27/20 - Future add Hep C TSH   Saralyn Pilar, DO [facility] 11/29/2019, 9:16 AM

## 2019-12-08 ENCOUNTER — Ambulatory Visit: Payer: Self-pay | Admitting: Surgery

## 2019-12-20 ENCOUNTER — Ambulatory Visit: Payer: Self-pay | Admitting: Surgery

## 2019-12-21 ENCOUNTER — Encounter: Payer: Self-pay | Admitting: *Deleted

## 2020-02-22 ENCOUNTER — Encounter: Payer: Self-pay | Admitting: *Deleted

## 2020-06-28 ENCOUNTER — Encounter: Payer: Self-pay | Admitting: Family Medicine

## 2020-06-28 ENCOUNTER — Other Ambulatory Visit: Payer: Self-pay

## 2020-06-28 ENCOUNTER — Ambulatory Visit (INDEPENDENT_AMBULATORY_CARE_PROVIDER_SITE_OTHER): Payer: BLUE CROSS/BLUE SHIELD | Admitting: Family Medicine

## 2020-06-28 VITALS — Ht 68.0 in | Wt 150.0 lb

## 2020-06-28 DIAGNOSIS — R112 Nausea with vomiting, unspecified: Secondary | ICD-10-CM | POA: Diagnosis not present

## 2020-06-28 DIAGNOSIS — R140 Abdominal distension (gaseous): Secondary | ICD-10-CM

## 2020-06-28 DIAGNOSIS — R197 Diarrhea, unspecified: Secondary | ICD-10-CM | POA: Diagnosis not present

## 2020-06-28 DIAGNOSIS — K219 Gastro-esophageal reflux disease without esophagitis: Secondary | ICD-10-CM | POA: Diagnosis not present

## 2020-06-28 DIAGNOSIS — R109 Unspecified abdominal pain: Secondary | ICD-10-CM

## 2020-06-28 MED ORDER — ONDANSETRON 4 MG PO TBDP
4.0000 mg | ORAL_TABLET | Freq: Three times a day (TID) | ORAL | 0 refills | Status: DC | PRN
Start: 2020-06-28 — End: 2021-06-26

## 2020-06-28 NOTE — Progress Notes (Signed)
Virtual Visit via Telephone The purpose of this virtual visit is to provide medical care while limiting exposure to the novel coronavirus (COVID19) for both patient and office staff.  Consent was obtained for phone visit:  Yes.   Answered questions that patient had about telehealth interaction:  Yes.   I discussed the limitations, risks, security and privacy concerns of performing an evaluation and management service by telephone. I also discussed with the patient that there may be a patient responsible charge related to this service. The patient expressed understanding and agreed to proceed.  Patient Location: Home Provider Location: [facility] (Office)  Participants in virtual visit: - Patient: Chris Mcbride - CMA: Burnell Blanks, CMA - Provider: Dr Althea Charon  ---------------------------------------------------------------------- Chief Complaint  Patient presents with  . Diarrhea  . Headache  . Fatigue    S: Reviewed CMA documentation. I have called patient and gathered additional HPI as follows:  DIARRHEA HEADACHE Reports that symptoms started Sun 1/16 about 3 days, with initial headache, then acute onset diarrhea loose stool and some watery, bloated, abdominal pain, cramping. Now today some improved slightly diarrhea episodes. He has tried some slight PO food and fluids but they provoked the diarrhea. Today he can take in some better PO today, some peanut butter toast this AM, and chicken noodle soup. - Tried OTC TUMs antacid He had similar episode in 04/2020 Admits some nausea, only vomit x 1 Admits sick contacts with mother and sister at home with cold symptoms. They have had negative COVID test at home. He did a rapid at home COVID test, on Monday 06/26/20 with NEGATIVE result. No known close exposure Unvaccinated against COVID  Patient is currently in isolation, not working due to illness Denies any known or suspected exposure to person with or  possibly with COVID19.  Denies any fever, chills, sweats, body ache, cough, shortness of breath, sinus pain or pressure  No past medical history on file. Social History   Tobacco Use  . Smoking status: Never Smoker  . Smokeless tobacco: Never Used  . Tobacco comment: Vape--from 3 years   Vaping Use  . Vaping Use: Every day  . Start date: 10/08/2016  Substance Use Topics  . Alcohol use: Yes    Alcohol/week: 6.0 standard drinks    Types: 6 Cans of beer per week  . Drug use: Not Currently    Types: Marijuana    Current Outpatient Medications:  .  loratadine (CLARITIN) 10 MG tablet, Take 10 mg by mouth daily., Disp: , Rfl:  .  ondansetron (ZOFRAN ODT) 4 MG disintegrating tablet, Take 1 tablet (4 mg total) by mouth every 8 (eight) hours as needed for nausea or vomiting., Disp: 30 tablet, Rfl: 0  Depression screen [facility] 2/9 11/29/2019 10/25/2019  Decreased Interest 0 0  Down, Depressed, Hopeless 0 0  PHQ - 2 Score 0 0    No flowsheet data found.  -------------------------------------------------------------------------- O: No physical exam performed due to remote telephone encounter.  Lab results reviewed.  No results found for this or any previous visit (from the past 2160 hour(s)).  -------------------------------------------------------------------------- A&P:  Problem List Items Addressed This Visit   None   Visit Diagnoses    Diarrhea, unspecified type    -  Primary   Abdominal bloating with cramps       Gastroesophageal reflux disease without esophagitis       Relevant Medications   ondansetron (ZOFRAN ODT) 4 MG disintegrating tablet   Nausea and vomiting,  intractability of vomiting not specified, unspecified vomiting type       Relevant Medications   ondansetron (ZOFRAN ODT) 4 MG disintegrating tablet     Consistent with viral gastroenteritis x 3 days symptoms Cannot rule out other viral syndrome, cannot rule out covid He is unvaccinated, sick family members at  home All covid rapid tests negative 06/26/20, one day after onset symptoms, likely too early Tolerating some PO now, reduced diarrhea No history to suggest acute dehydration today  Plan: 1. Reassurance, likely self-limited 7-10 days 2. Continue PO as tolerated. May try to increase hydration, clear fluids (gatorade, pedialyate, water) 3. Continue Tylenol. May add Motrin q 6 hr PRN fever 4. Precaution given rx Zofran 4mg  ODT q 8 hr PRN for nausea 5. Trial on OTC PPI   Repeat covid test on Friday  Work note written  Return criteria reviewed   Meds ordered this encounter  Medications  . ondansetron (ZOFRAN ODT) 4 MG disintegrating tablet    Sig: Take 1 tablet (4 mg total) by mouth every 8 (eight) hours as needed for nausea or vomiting.    Dispense:  30 tablet    Refill:  0    Follow-up: - Return in 1-2 weeks if not improved  Patient verbalizes understanding with the above medical recommendations including the limitation of remote medical advice.  Specific follow-up and call-back criteria were given for patient to follow-up or seek medical care more urgently if needed.   - Time spent in direct consultation with patient on phone: 12 minutes   Saturday, DO [facility] 06/28/2020, 4:43 PM

## 2020-06-28 NOTE — Patient Instructions (Addendum)
Cannot rule out covid Repeat test on Friday if able If positive notify our office, need to be out 5 days after positive test If negative follow with plan to return on Monday if improved  Use Zofran as needed for nausea Goal to improve diet and intake  May use Prilosec (omeprazole) or Nexium (esomeprazole) stomach acid med, 20mg  OTC option daily before breakfast for 2-4 weeks to see if helpful.  Follow-up if this works and need to discuss stomach acid more.   Please schedule a Follow-up Appointment to: Return if symptoms worsen or fail to improve.  If you have any other questions or concerns, please feel free to call the office or send a message through MyChart. You may also schedule an earlier appointment if necessary.  Additionally, you may be receiving a survey about your experience at our office within a few days to 1 week by e-mail or mail. We value your feedback.  , DO [facility], [facility]

## 2020-11-07 ENCOUNTER — Telehealth: Payer: Self-pay | Admitting: Family Medicine

## 2020-11-07 DIAGNOSIS — K219 Gastro-esophageal reflux disease without esophagitis: Secondary | ICD-10-CM

## 2020-11-07 MED ORDER — OMEPRAZOLE 20 MG PO CPDR
20.0000 mg | DELAYED_RELEASE_CAPSULE | Freq: Every day | ORAL | 1 refills | Status: DC
Start: 2020-11-07 — End: 2021-05-09

## 2020-11-07 NOTE — Telephone Encounter (Signed)
Sent rx Omeprazole 20mg  daily to pharmacy  , DO [facility] 11/07/2020, 5:24 PM

## 2020-11-07 NOTE — Telephone Encounter (Signed)
Patient would like the nurse to call regarding a medication that he said the doctor wanted him to try for his stomach issues.  Please advise and call to discuss at [phone]

## 2020-11-07 NOTE — Telephone Encounter (Signed)
Patient called to inform us that the OTC Prilosec (omeprazole) has been helping with his GERD and he would like to know if he may start a prescription for the medication. He uses [facility] in [place].

## 2020-11-27 ENCOUNTER — Other Ambulatory Visit: Payer: PRIVATE HEALTH INSURANCE

## 2020-11-30 ENCOUNTER — Ambulatory Visit: Payer: BC Managed Care – PPO | Admitting: Family Medicine

## 2020-11-30 ENCOUNTER — Encounter: Payer: Self-pay | Admitting: Family Medicine

## 2020-11-30 ENCOUNTER — Other Ambulatory Visit: Payer: Self-pay

## 2020-11-30 VITALS — Ht 69.0 in | Wt 150.0 lb

## 2020-11-30 DIAGNOSIS — J0110 Acute frontal sinusitis, unspecified: Secondary | ICD-10-CM | POA: Diagnosis not present

## 2020-11-30 DIAGNOSIS — J029 Acute pharyngitis, unspecified: Secondary | ICD-10-CM

## 2020-11-30 MED ORDER — AMOXICILLIN-POT CLAVULANATE 875-125 MG PO TABS: 1.0000 | ORAL_TABLET | Freq: Two times a day (BID) | ORAL | 0 refills | Status: DC

## 2020-11-30 MED ORDER — FLUTICASONE PROPIONATE 50 MCG/ACT NA SUSP: 2.0000 | Freq: Every day | NASAL | 3 refills | Status: DC

## 2020-11-30 NOTE — Progress Notes (Signed)
Virtual Visit via Telephone The purpose of this virtual visit is to provide medical care while limiting exposure to the novel coronavirus (COVID19) for both patient and office staff.  Consent was obtained for phone visit:  Yes.   Answered questions that patient had about telehealth interaction:  Yes.   I discussed the limitations, risks, security and privacy concerns of performing an evaluation and management service by telephone. I also discussed with the patient that there may be a patient responsible charge related to this service. The patient expressed understanding and agreed to proceed.  Patient Location: Home Provider Location: [facility] (Office)  Participants in virtual visit: - Patient: Chris Mcbride - CMA: Burnell Blanks, CMA - Provider: Dr Althea Charon  ---------------------------------------------------------------------- Chief Complaint  Patient presents with   Sore Throat   Nasal Congestion   Headache    S: Reviewed CMA documentation. I have called patient and gathered additional HPI as follows:  Sinusitis Reports that symptoms started 2 days ago with Tuesday night, gradual persistent and some worsening with sinus pressure headache worse today. - Tried OTC Sinus congestion release pills without as much improvement - Home COVID Test negative - Unvaccinated to COVID Using Claritin and Flonase OTC, Saline  Denies any known or suspected exposure to person with or possibly with COVID19.  Denies any fevers, chills, sweats, body ache, cough, shortness of breath, sinus pain or pressure, headache, abdominal pain, diarrhea  History reviewed. No pertinent past medical history. Social History   Tobacco Use   Smoking status: Never   Smokeless tobacco: Never   Tobacco comments:    Vape--from 3 years   Vaping Use   Vaping Use: Every day   Start date: 10/08/2016  Substance Use Topics   Alcohol use: Yes    Alcohol/week: 6.0 standard drinks     Types: 6 Cans of beer per week   Drug use: Not Currently    Types: Marijuana    Current Outpatient Medications:    amoxicillin-clavulanate (AUGMENTIN) 875-125 MG tablet, Take 1 tablet by mouth 2 (two) times daily., Disp: 20 tablet, Rfl: 0   fluticasone (FLONASE) 50 MCG/ACT nasal spray, Place 2 sprays into both nostrils daily. Use for 4-6 weeks then stop and use seasonally or as needed., Disp: 16 g, Rfl: 3   loratadine (CLARITIN) 10 MG tablet, Take 10 mg by mouth daily., Disp: , Rfl:    omeprazole (PRILOSEC) 20 MG capsule, Take 1 capsule (20 mg total) by mouth daily before breakfast., Disp: 90 capsule, Rfl: 1   ondansetron (ZOFRAN ODT) 4 MG disintegrating tablet, Take 1 tablet (4 mg total) by mouth every 8 (eight) hours as needed for nausea or vomiting., Disp: 30 tablet, Rfl: 0  Depression screen [facility] 2/9 11/29/2019 10/25/2019  Decreased Interest 0 0  Down, Depressed, Hopeless 0 0  PHQ - 2 Score 0 0    No flowsheet data found.  -------------------------------------------------------------------------- O: No physical exam performed due to remote telephone encounter.  Lab results reviewed.  No results found for this or any previous visit (from the past 2160 hour(s)).  -------------------------------------------------------------------------- A&P:  Problem List Items Addressed This Visit   None Visit Diagnoses     Acute non-recurrent frontal sinusitis    -  Primary   Relevant Medications   amoxicillin-clavulanate (AUGMENTIN) 875-125 MG tablet   fluticasone (FLONASE) 50 MCG/ACT nasal spray   Pharyngitis, unspecified etiology          Consistent with acute frontal rhinosinusitis, likely initially viral URI vs  allergic rhinitis component without evidence of bacterial infection  COVID19 test negative today Unvaccinated May repeat COVID test if symptom worse  Plan: 1. Reassurance, likely self-limited - no indication for antibiotics at this time - Only if UNRESOLVED in 48-72  hours - he can pick up / Start Augmentin 875-125mg  PO BID x 10 days 2. Continue Claritin, Decongestant 3. Start nasal steroid Flonase 2 sprays in each nostril daily for 4-6 weeks, may repeat course seasonally or as needed  Return criteria reviewed    Meds ordered this encounter  Medications   amoxicillin-clavulanate (AUGMENTIN) 875-125 MG tablet    Sig: Take 1 tablet by mouth 2 (two) times daily.    Dispense:  20 tablet    Refill:  0   fluticasone (FLONASE) 50 MCG/ACT nasal spray    Sig: Place 2 sprays into both nostrils daily. Use for 4-6 weeks then stop and use seasonally or as needed.    Dispense:  16 g    Refill:  3    Follow-up: - Return in 1 week if unresolved  Patient verbalizes understanding with the above medical recommendations including the limitation of remote medical advice.  Specific follow-up and call-back criteria were given for patient to follow-up or seek medical care more urgently if needed.   - Time spent in direct consultation with patient on phone: 8 minutes  Saralyn Pilar, DO [facility] 11/30/2020, 11:05 AM

## 2020-11-30 NOTE — Patient Instructions (Signed)
Thank you for coming to the office today.  Plan: 1. Reassurance, likely self-limited - no indication for antibiotics at this time - Only if UNRESOLVED in 48-72 hours - he can pick up / Start Augmentin 875-125mg  PO BID x 10 days 2. Continue Claritin, Decongestant 3. Start nasal steroid Flonase 2 sprays in each nostril daily for 4-6 weeks, may repeat course seasonally or as needed  Please schedule a Follow-up Appointment to: No follow-ups on file.  If you have any other questions or concerns, please feel free to call the office or send a message through MyChart. You may also schedule an earlier appointment if necessary.  Additionally, you may be receiving a survey about your experience at our office within a few days to 1 week by e-mail or mail. We value your feedback.  Saralyn Pilar, DO [facility], [place]

## 2020-12-01 ENCOUNTER — Telehealth: Payer: Self-pay

## 2020-12-01 NOTE — Telephone Encounter (Signed)
Copied from CRM [id number]. Topic: General - Other >> Nov 30, 2020  3:39 PM Pawlus, Maxine Glenn A wrote: Reason for CRM: Pt is requested a doctors note from his visit today, pt needs it to show his employer.   Letter generated and sent to the patient via mychart.

## 2020-12-04 ENCOUNTER — Encounter: Payer: PRIVATE HEALTH INSURANCE | Admitting: Family Medicine

## 2021-05-08 ENCOUNTER — Other Ambulatory Visit: Payer: Self-pay | Admitting: Family Medicine

## 2021-05-08 DIAGNOSIS — K219 Gastro-esophageal reflux disease without esophagitis: Secondary | ICD-10-CM

## 2021-05-09 ENCOUNTER — Other Ambulatory Visit: Payer: Self-pay | Admitting: Family Medicine

## 2021-05-09 DIAGNOSIS — J0110 Acute frontal sinusitis, unspecified: Secondary | ICD-10-CM

## 2021-05-09 NOTE — Telephone Encounter (Signed)
Requested Prescriptions  Pending Prescriptions Disp Refills  . omeprazole (PRILOSEC) 20 MG capsule [Pharmacy Med Name: OMEPRAZOLE DR 20 MG CAPSULE] 90 capsule 0    Sig: TAKE 1 CAPSULE BY MOUTH DAILY BEFORE BREAKFAST.     Gastroenterology: Proton Pump Inhibitors Failed - 05/08/2021  1:21 AM      Failed - Valid encounter within last 12 months    Recent Outpatient Visits          5 months ago Acute non-recurrent frontal sinusitis   [facility], Netta Neat, DO   10 months ago Diarrhea, unspecified type   [facility] Althea Charon, Netta Neat, DO   1 year ago Annual physical exam   [facility] Smitty Cords, DO   1 year ago Seasonal allergic rhinitis due to other allergic trigger   [facility] [place], Netta Neat, DO

## 2021-05-10 NOTE — Telephone Encounter (Signed)
Requested medication (s) are due for refill today: unclear  Requested medication (s) are on the active medication list: yes  Last refill:  04/12/21  Future visit scheduled: no  Notes to clinic:  ordered to use for 4-6 weeks, already had rx for one with 3 refills, no upcoming appt, please assess.      Requested Prescriptions  Pending Prescriptions Disp Refills   fluticasone (FLONASE) 50 MCG/ACT nasal spray [Pharmacy Med Name: FLUTICASONE PROP 50 MCG SPRAY] 16 mL 3    Sig: Place 2 sprays into both nostrils daily. Use for 4-6 weeks then stop and use seasonally or as needed.     Ear, Nose, and Throat: Nasal Preparations - Corticosteroids Failed - 05/09/2021  7:43 AM      Failed - Valid encounter within last 12 months    Recent Outpatient Visits           5 months ago Acute non-recurrent frontal sinusitis   [facility] [place], Netta Neat, DO   10 months ago Diarrhea, unspecified type   [facility] Althea Charon, Netta Neat, DO   1 year ago Annual physical exam   [facility] Smitty Cords, DO   1 year ago Seasonal allergic rhinitis due to other allergic trigger   [facility] [place], Netta Neat, DO

## 2021-06-26 ENCOUNTER — Encounter: Payer: Self-pay | Admitting: Surgery

## 2021-06-26 ENCOUNTER — Ambulatory Visit: Payer: BC Managed Care – PPO | Admitting: Surgery

## 2021-06-26 ENCOUNTER — Other Ambulatory Visit: Payer: Self-pay

## 2021-06-26 VITALS — BP 106/75 | HR 83 | Temp 98.7°F | Ht 69.0 in | Wt 154.0 lb

## 2021-06-26 DIAGNOSIS — L723 Sebaceous cyst: Secondary | ICD-10-CM

## 2021-06-26 NOTE — Progress Notes (Signed)
Patient ID: Chris Mcbride, male   DOB: 04/22/1991, 31 y.o.   MRN: [id number]  Chief Complaint: Painful left chest wall mass  History of Present Illness Chris Mcbride is a 31 y.o. male with a lump present in this area for approximately 1 year.  Enlargement over the last week with associated tenderness and pain.  He denies any fevers or chills.  Denies any known drainage.  Denies any prior manipulation or trauma.  Recently initiated on Augmentin.  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 USE FOR 4-6 WEEKS THEN STOP AND USE SEASONALLY OR AS NEEDED. 16 mL 3   loratadine (CLARITIN) 10 MG tablet Take 10 mg by mouth daily.     omeprazole (PRILOSEC) 20 MG capsule TAKE 1 CAPSULE BY MOUTH DAILY BEFORE BREAKFAST. 90 capsule 0   No current facility-administered medications for this visit.    Family History Family History  Problem Relation Age of Onset   Cancer Mother        breast   Breast cancer Mother    Stroke Father    Diabetes Father    Cancer Father        prostate   Prostate cancer Father    Irregular heart beat Sister        heart monitor, palpitation, no clear diagnosis      Social History Social History   Tobacco Use   Smoking status: Never   Smokeless tobacco: Never   Tobacco comments:    Vape--from 3 years   Vaping Use   Vaping Use: Every day   Start date: 10/08/2016  Substance Use Topics   Alcohol use: Yes    Alcohol/week: 6.0 standard drinks    Types: 6 Cans of beer per week   Drug use: Not Currently    Types:  Marijuana        Review of Systems  Constitutional: Negative.   HENT: Negative.    Eyes: Negative.   Respiratory: Negative.    Cardiovascular: Negative.   Gastrointestinal: Negative.   Genitourinary: Negative.   Skin: Negative.   Neurological: Negative.   Psychiatric/Behavioral: Negative.       Physical Exam Blood pressure 106/75, pulse 83, temperature 98.7 F (37.1 C), height 5\' 9"  (1.753 m), weight 154 lb (69.9 kg), SpO2 99 %. Last Weight  Most recent update: 06/26/2021  9:12 AM    Weight  69.9 kg (154 lb)             CONSTITUTIONAL: Well developed, and nourished, appropriately responsive and aware without distress.   EYES: Sclera non-icteric.   EARS, NOSE, MOUTH AND THROAT: Mask worn.    Hearing is intact to voice.  NECK: Trachea is midline, and there is no jugular venous distension.  LYMPH NODES:  Lymph nodes in the neck are not enlarged. RESPIRATORY:  Lungs are clear, and breath sounds are equal bilaterally. Normal respiratory effort without pathologic use of accessory muscles. CARDIOVASCULAR: Heart is regular in rate and rhythm. GI: The abdomen is  soft, nontender, and nondistended.  MUSCULOSKELETAL:  Symmetrical muscle tone appreciated in all four extremities.    SKIN: Skin turgor is normal. No pathologic skin lesions appreciated.   Upper medial quadrant of the left breast area is a mildly indurated firm mass approximately 3.1 cm.  No obvious punctum, no evidence of drainage, no fluctuance, tender to touch.  Appears consistent with dermal origin NEUROLOGIC:  Motor and sensation appear grossly normal.  Cranial nerves are grossly without defect. PSYCH:  Alert and oriented to person, place and time. Affect is appropriate for situation.  Data Reviewed I have personally reviewed what is currently available of the patient's imaging, recent labs and medical records.   Labs:  CBC Latest Ref Rng & Units 11/15/2019  WBC 3.8 - 10.8 Thousand/uL 5.2  Hemoglobin 13.2 - 17.1 g/dL  14.8  Hematocrit 38.5 - 50.0 % 45.5  Platelets 140 - 400 Thousand/uL 184   CMP Latest Ref Rng & Units 11/15/2019  Glucose 65 - 99 mg/dL 103(H)  BUN 7 - 25 mg/dL 16  Creatinine 0.60 - 1.35 mg/dL 0.91  Sodium 135 - 146 mmol/L 140  Potassium 3.5 - 5.3 mmol/L 4.5  Chloride 98 - 110 mmol/L 107  CO2 20 - 32 mmol/L 27  Calcium 8.6 - 10.3 mg/dL 9.1  Total Protein 6.1 - 8.1 g/dL 6.2  Total Bilirubin 0.2 - 1.2 mg/dL 0.2  AST 10 - 40 U/L 12  ALT 9 - 46 U/L 17      Imaging:  Within last 24 hrs: No results found.  Assessment    Inflamed sebaceous cyst left chest wall. 3.1 cm.  Patient Active Problem List   Diagnosis Date Noted   Hypertriglyceridemia 11/29/2019   Allergic rhinitis due to allergen 10/25/2019   Environmental and seasonal allergies 10/25/2019   Nodule of left anterior chest wall 10/25/2019   Urinary urgency 10/25/2019    Plan    Proceed with excision today.  Risks of local anesthesia, bleeding infection, recurrence discussed in detail with patient.  Questions answered, no guarantees ever expressed or implied.  Procedure detail: Patient is placed in supine position and are procedure room #9.  Informed consent is obtained and timeout completed.  Left chest wall is prepped after clipping, utilizing ChloraPrep.  Sterile drape to protect our operative field.  Area of the cyst was then locally infiltrated with 1% lidocaine with epinephrine to adequate anesthetic effect.  The presumed center/site of the punctum was excised elliptically.  We readily encountered the contents of the cyst, and proceeded with removal of the contents and the cystic capsule.  Cystic capsule was removed in pieces.  We believe all of it was adequately retrieved.  Adequate hemostasis obtained with hemostat.  Incision was then closed with interrupted dermal's of 3-0 Vicryl and subcuticulars of 4-0 Monocryl.  Skin was sealed with Dermabond.  Patient tolerated procedure well. Specimen clearly of benign sebaceous  cyst etiology discarded.  Face-to-face time spent with the patient and accompanying care providers(if present) was 45 minutes, with more than 50% of the time spent counseling, educating, and coordinating care of the patient.    These notes generated with voice recognition software. I apologize for typographical errors.  Ronny Bacon M.D., FACS 06/26/2021, 10:36 AM

## 2021-06-26 NOTE — Patient Instructions (Signed)
We have removed a Cyst in our office today.  You have sutures under the skin that will dissolve and also dermabond (skin glue) on top of your skin which will come off on it's own in 10-14 days.  You may shower tomorrow, do not scrub at the area.  You may take Ibuprofen or Tylenol as needed for discomfort or pain. Use ice to the area several times today and tomorrow if needed.   Avoid Strenuous activities that will make you sweat during the next 48 hours to avoid the glue coming off prematurely. Avoid activities that will place pressure to this area of the body for 1-2 weeks to avoid re-injury to incision site.  Please see your follow-up appointment provided. We will see you back in office to make sure this area is healed and to review the final pathology. If you have any questions or concerns prior to this appointment, call our office and speak with a nurse.    Excision of Skin Cysts or Lesions Excision of a skin lesion refers to the removal of a section of skin by making small cuts (incisions) in the skin. This procedure may be done to remove a cancerous (malignant) or noncancerous (benign) growth on the skin. It is typically done to treat or prevent cancer or infection. It may also be done to improve cosmetic appearance. The procedure may be done to remove: Cancerous growths, such as basal cell carcinoma, squamous cell carcinoma, or melanoma. Noncancerous growths, such as a cyst or lipoma. Growths, such as moles or skin tags, which may be removed for cosmetic reasons.  Various excision or surgical techniques may be used depending on your condition, the location of the lesion, and your overall health. Tell a health care provider about: Any allergies you have. All medicines you are taking, including vitamins, herbs, eye drops, creams, and over-the-counter medicines. Any problems you or family members have had with anesthetic medicines. Any blood disorders you have. Any surgeries you have  had. Any medical conditions you have. Whether you are pregnant or may be pregnant. What are the risks? Generally, this is a safe procedure. However, problems may occur, including: Bleeding. Infection. Scarring. Recurrence of the cyst, lipoma, or cancer. Changes in skin sensation or appearance, such as discoloration or swelling. Reaction to the anesthetics. Allergic reaction to surgical materials or ointments. Damage to nerves, blood vessels, muscles, or other structures. Continued pain.  What happens before the procedure? Ask your health care provider about: Changing or stopping your regular medicines. This is especially important if you are taking diabetes medicines or blood thinners. Taking medicines such as aspirin and ibuprofen. These medicines can thin your blood. Do not take these medicines before your procedure if your health care provider instructs you not to. You may be asked to take certain medicines. You may be asked to stop smoking. You may have an exam or testing. Plan to have someone take you home after the procedure. Plan to have someone help you with activities during recovery. What happens during the procedure? To reduce your risk of infection: Your health care team will wash or sanitize their hands. Your skin will be washed with soap. You will be given a medicine to numb the area (local anesthetic). One of the following excision techniques will be performed. At the end of any of these procedures, antibiotic ointment will be applied as needed. Each of the following techniques may vary among health care providers and hospitals. Complete Surgical Excision The area of skin that  needs to be removed will be marked with a pen. Using a small scalpel or scissors, the surgeon will gently cut around and under the lesion until it is completely removed. The lesion will be placed in a fluid and sent to the lab for examination. If necessary, bleeding will be controlled with a  device that delivers heat (electrocautery). The edges of the wound may be stitched (sutured) together, and a bandage (dressing) will be applied. This procedure may be performed to treat a cancerous growth or a noncancerous cyst or lesion. Excision of a Cyst The surgeon will make an incision on the cyst. The entire cyst will be removed through the incision. The incision may be closed with sutures under the skin and glue on top.

## 2021-07-05 ENCOUNTER — Encounter: Payer: Self-pay | Admitting: Surgery

## 2021-07-05 ENCOUNTER — Ambulatory Visit (INDEPENDENT_AMBULATORY_CARE_PROVIDER_SITE_OTHER): Payer: BC Managed Care – PPO | Admitting: Surgery

## 2021-07-05 ENCOUNTER — Other Ambulatory Visit: Payer: Self-pay

## 2021-07-05 VITALS — BP 125/85 | HR 85 | Temp 98.0°F | Ht 69.0 in | Wt 157.0 lb

## 2021-07-05 DIAGNOSIS — L723 Sebaceous cyst: Secondary | ICD-10-CM

## 2021-07-05 NOTE — Patient Instructions (Signed)
The glue should continue to flake off in 1-2 weeks. The area will still be tender for a few more weeks.   Once the glue comes off, you may rub Vitamin-E oil or other emmolient agent in area 2-3 times a day to soften. You will need to use sunscreen for the next year on the area to minimize altered pigmentation of the site.    Follow-up with our office as needed.  Please call and ask to speak with a nurse if you develop questions or concerns.

## 2021-07-05 NOTE — Progress Notes (Signed)
[facility] POST-OP OFFICE VISIT  07/05/2021  HPI: Chris Mcbride is a 31 y.o. male s/p excision of the left chest wall lesion/cyst.  Reports some tenderness with seatbelt.  He works for [employer].  Otherwise has been protecting the Dermabond by minimizing water exposure in the shower.  So minimal lingering tenderness.  Vital signs: BP 125/85    Pulse 85    Temp 98 F (36.7 C)    Ht 5\' 9"  (1.753 m)    Wt 157 lb (71.2 kg)    SpO2 98%    BMI 23.18 kg/m    Physical Exam: Constitutional: He appears well.  Skin: Expected postoperative changes with pink rim along incision line, well covered with Dermabond.  No evidence of inflammatory changes or induration.  Assessment/Plan: This is a 31 y.o. male s/p left chest wall dermal cyst excision, inflamed.  Patient Active Problem List   Diagnosis Date Noted   Hypertriglyceridemia 11/29/2019   Allergic rhinitis due to allergen 10/25/2019   Environmental and seasonal allergies 10/25/2019   Nodule of left anterior chest wall 10/25/2019   Urinary urgency 10/25/2019    -Reassured him that the slight tenderness he feels is likely wear and tear with his current work.  Expectant that the Dermabond will peel away in a couple weeks if he continues to be careful with it.  Advised that we are readily available should any recurrence of inflammation, arise.  We will be glad to assist him in any way.   10/27/2019 M.D., FACS 07/05/2021, 10:04 AM

## 2021-08-08 ENCOUNTER — Other Ambulatory Visit: Payer: Self-pay | Admitting: Family Medicine

## 2021-08-08 DIAGNOSIS — J0110 Acute frontal sinusitis, unspecified: Secondary | ICD-10-CM

## 2021-08-08 DIAGNOSIS — K219 Gastro-esophageal reflux disease without esophagitis: Secondary | ICD-10-CM

## 2021-08-08 NOTE — Telephone Encounter (Signed)
Requested medication (s) are due for refill today:   Yes for both ? ?Requested medication (s) are on the active medication list:   Yes for both ? ?Future visit scheduled:   No ? ? ?Last ordered: Prilosec 05/09/2021 #90, 0 refills;  Flonase Spray 05/11/2021 16 ml, 3 refills ? ?Returned because  non delegated refill per protocol.  ? ?Requested Prescriptions  ?Pending Prescriptions Disp Refills  ? omeprazole (PRILOSEC) 20 MG capsule [Pharmacy Med Name: OMEPRAZOLE DR 20 MG CAPSULE] 90 capsule 0  ?  Sig: TAKE 1 CAPSULE BY MOUTH EVERY DAY BEFORE BREAKFAST  ?  ? Gastroenterology: Proton Pump Inhibitors Failed - 08/08/2021  1:42 AM  ?  ?  Failed - Valid encounter within last 12 months  ?  Recent Outpatient Visits   ? ?      ? 8 months ago Acute non-recurrent frontal sinusitis  ? [facility] [place], Netta Neat, DO  ? 1 year ago Diarrhea, unspecified type  ? [facility] [facility], Netta Neat, DO  ? 1 year ago Annual physical exam  ? [facility] Smitty Cords, DO  ? 1 year ago Seasonal allergic rhinitis due to other allergic trigger  ? [facility] [place], Netta Neat, DO  ? ?  ?  ? ?  ?  ?  ? fluticasone (FLONASE) 50 MCG/ACT nasal spray [Pharmacy Med Name: FLUTICASONE PROP 50 MCG SPRAY] 48 mL 1  ?  Sig: PLACE 2 SPRAYS INTO BOTH NOSTRILS DAILY. USE FOR 4-6 WEEKS THEN STOP AND USE SEASONALLY OR AS NEEDED.  ?  ? Not Delegated - Ear, Nose, and Throat: Nasal Preparations - Corticosteroids Failed - 08/08/2021  1:42 AM  ?  ?  Failed - This refill cannot be delegated  ?  ?  Failed - Valid encounter within last 12 months  ?  Recent Outpatient Visits   ? ?      ? 8 months ago Acute non-recurrent frontal sinusitis  ? [facility] [place], Netta Neat, DO  ? 1 year ago Diarrhea, unspecified type  ? [facility], Netta Neat, DO  ? 1 year ago Annual physical exam  ? [facility] Smitty Cords, DO  ? 1 year ago Seasonal allergic rhinitis due to other allergic trigger  ? [facility] Smitty Cords, DO  ? ?  ?  ? ?  ?  ?  ? ?

## 2021-10-24 ENCOUNTER — Telehealth: Payer: BC Managed Care – PPO | Admitting: Internal Medicine

## 2021-10-24 ENCOUNTER — Encounter: Payer: Self-pay | Admitting: Internal Medicine

## 2021-10-24 DIAGNOSIS — J029 Acute pharyngitis, unspecified: Secondary | ICD-10-CM

## 2021-10-24 MED ORDER — AZITHROMYCIN 250 MG PO TABS: ORAL_TABLET | ORAL | 0 refills | Status: DC

## 2021-10-24 NOTE — Progress Notes (Signed)
Virtual Visit via Video Note ? ?I connected with Chris Mcbride on 10/24/21 at 11:20 AM EDT by a video enabled telemedicine application and verified that I am speaking with the correct person using two identifiers. ? ?Location: ?Patient: Home ?Provider: Office ? ?Persons participating in this video call: Webb Silversmith, NP and [name]. ?  ?I discussed the limitations of evaluation and management by telemedicine and the availability of in person appointments. The patient expressed understanding and agreed to proceed. ? ?History of Present Illness: ? ?Patient reports headache, runny nose, sore throat and diarrhea.  This started yesterday. The headache is located all over his head. He describes the pain as a fullness.  He is blowing clear mucus out of his nose.  He is having difficulty swallowing. He denies nasal congestion, ear pain, cough, shortness of breath, nausea, vomiting.  He denies fever, chills or body aches.  He has not taken anything OTC for symptoms.  He has had sick contacts with strep. ? ?Past Medical History:  ?Diagnosis Date  ? Allergy   ? GERD (gastroesophageal reflux disease)   ? ? ?Current Outpatient Medications  ?Medication Sig Dispense Refill  ? fluticasone (FLONASE) 50 MCG/ACT nasal spray PLACE 2 SPRAYS INTO BOTH NOSTRILS DAILY. USE FOR 4-6 WEEKS THEN STOP AND USE SEASONALLY OR AS NEEDED. 48 mL 1  ? loratadine (CLARITIN) 10 MG tablet Take 10 mg by mouth daily.    ? omeprazole (PRILOSEC) 20 MG capsule TAKE 1 CAPSULE BY MOUTH EVERY DAY BEFORE BREAKFAST 90 capsule 1  ? ?No current facility-administered medications for this visit.  ? ? ?Allergies  ?Allergen Reactions  ? Dust Mite Extract   ? Seasonal Ic [Cholestatin]   ? ? ?Family History  ?Problem Relation Age of Onset  ? Cancer Mother   ?     breast  ? Breast cancer Mother   ? Stroke Father   ? Diabetes Father   ? Cancer Father   ?     prostate  ? Prostate cancer Father   ? Irregular heart beat Sister   ?     heart monitor,  palpitation, no clear diagnosis  ? ? ?Social History  ? ?Socioeconomic History  ? Marital status: Single  ?  Spouse name: Not on file  ? Number of children: Not on file  ? Years of education: High School  ? Highest education level: High school graduate  ?Occupational History  ? Occupation: Education officer, community  ?Tobacco Use  ? Smoking status: Never  ? Smokeless tobacco: Never  ? Tobacco comments:  ?  Vape--from 3 years   ?Vaping Use  ? Vaping Use: Every day  ? Start date: 10/08/2016  ?Substance and Sexual Activity  ? Alcohol use: Yes  ?  Alcohol/week: 6.0 standard drinks  ?  Types: 6 Cans of beer per week  ? Drug use: Not Currently  ?  Types: Marijuana  ? Sexual activity: Not on file  ?Other Topics Concern  ? Not on file  ?Social History Narrative  ? Not on file  ? ?Social Determinants of Health  ? ?Financial Resource Strain: Not on file  ?Food Insecurity: Not on file  ?Transportation Needs: Not on file  ?Physical Activity: Not on file  ?Stress: Not on file  ?Social Connections: Not on file  ?Intimate Partner Violence: Not on file  ? ? ? ?Constitutional: Patient reports headache.  Denies fever, malaise, fatigue, or abrupt weight changes.  ?HEENT: Patient reports runny nose, sore throat.  Denies eye  pain, eye redness, ear pain, ringing in the ears, wax buildup, runny nose, nasal congestion, bloody nose. ?Respiratory: Denies difficulty breathing, shortness of breath, cough or sputum production.   ?Cardiovascular: Denies chest pain, chest tightness, palpitations or swelling in the hands or feet.  ?Gastrointestinal: Patient reports diarrhea.  Denies abdominal pain, bloating, constipation, or blood in the stool.  ? ? ?No other specific complaints in a complete review of systems (except as listed in HPI above). ? ?  ? ?Wt Readings from Last 3 Encounters:  ?07/05/21 157 lb (71.2 kg)  ?06/26/21 154 lb (69.9 kg)  ?11/30/20 150 lb (68 kg)  ? ? ?General: Appears his stated age, ill-appearing but in NAD. ?HEENT: Head: normal shape and  size; Nose: No congestion noted; Throat/Mouth: Hoarseness noted ?Pulmonary/Chest: Normal effort . No respiratory distress.  ?Neurological: Alert and oriented.  ? ?BMET ?   ?Component Value Date/Time  ? NA 140 11/15/2019 0829  ? K 4.5 11/15/2019 0829  ? CL 107 11/15/2019 0829  ? CO2 27 11/15/2019 0829  ? GLUCOSE 103 (H) 11/15/2019 0829  ? BUN 16 11/15/2019 0829  ? CREATININE 0.91 11/15/2019 0829  ? CALCIUM 9.1 11/15/2019 0829  ? GFRNONAA 116 11/15/2019 0829  ? GFRAA 134 11/15/2019 0829  ? ? ?Lipid Panel  ?   ?Component Value Date/Time  ? CHOL 183 11/15/2019 0829  ? TRIG 172 (H) 11/15/2019 0829  ? HDL 68 11/15/2019 0829  ? CHOLHDL 2.7 11/15/2019 0829  ? Ward 88 11/15/2019 0829  ? ? ?CBC ?   ?Component Value Date/Time  ? WBC 5.2 11/15/2019 0829  ? RBC 4.83 11/15/2019 0829  ? HGB 14.8 11/15/2019 0829  ? HCT 45.5 11/15/2019 0829  ? PLT 184 11/15/2019 0829  ? MCV 94.2 11/15/2019 0829  ? MCH 30.6 11/15/2019 0829  ? MCHC 32.5 11/15/2019 0829  ? RDW 12.2 11/15/2019 0829  ? LYMPHSABS 1,024 11/15/2019 0829  ? EOSABS 73 11/15/2019 0829  ? BASOSABS 42 11/15/2019 0829  ? ? ?Hgb A1C ?Lab Results  ?Component Value Date  ? HGBA1C 4.9 11/15/2019  ? ? ? ? ? ?Assessment and Plan: ? ?Acute Headache, Runny Nose, Sore Throat, Diarrhea: ? ?Concerning for strep given his positive exposure ?He declines coming in for COVID/flu/RSV or strep testing ?Rx for azithromycin 250 mg p.o. x5 days given ?Can take Ibuprofen 600 mg twice daily as needed for inflammation ?Salt water gargles may be helpful ? ?Return precautions discussed ?Follow Up Instructions: ? ?  ?I discussed the assessment and treatment plan with the patient. The patient was provided an opportunity to ask questions and all were answered. The patient agreed with the plan and demonstrated an understanding of the instructions. ?  ?The patient was advised to call back or seek an in-person evaluation if the symptoms worsen or if the condition fails to improve as  anticipated. ? ? ? ?Webb Silversmith, NP ? ?

## 2021-10-24 NOTE — Patient Instructions (Signed)
Strep Throat, Adult Strep throat is an infection of the throat. It is caused by germs (bacteria). Strep throat is common during the cold months of the year. It mostly affects children who are 5-29 years old. However, people of all ages can get it at any time of the year. This infection spreads from person to person through coughing, sneezing, or having close contact. What are the causes? This condition is caused by the Streptococcus pyogenes germ. What increases the risk? You care for young children. Children are more likely to get strep throat and may spread it to others. You go to crowded places. Germs can spread easily in such places. You kiss or touch someone who has strep throat. What are the signs or symptoms? Fever or chills. Redness, swelling, or pain in the tonsils or throat. Pain or trouble when swallowing. White or yellow spots on the tonsils or throat. Tender glands in the neck and under the jaw. Bad breath. Red rash all over the body. This is rare. How is this treated? Medicines that kill germs (antibiotics). Medicines that treat pain or fever. These include: Ibuprofen or acetaminophen. Aspirin, only for people who are over the age of 18. Cough drops. Throat sprays. Follow these instructions at home: Medicines  Take over-the-counter and prescription medicines only as told by your doctor. Take your antibiotic medicine as told by your doctor. Do not stop taking the antibiotic even if you start to feel better. Eating and drinking  If you have trouble swallowing, eat soft foods until your throat feels better. Drink enough fluid to keep your pee (urine) pale yellow. To help with pain, you may have: Warm fluids, such as soup and tea. Cold fluids, such as frozen desserts or popsicles. General instructions Rinse your mouth (gargle) with a salt-water mixture 3-4 times a day or as needed. To make a salt-water mixture, dissolve -1 tsp (3-6 g) of salt in 1 cup (237 mL) of warm  water. Rest as much as you can. Stay home from work or school until you have been taking antibiotics for 24 hours. Do not smoke or use any products that contain nicotine or tobacco. If you need help quitting, ask your doctor. Keep all follow-up visits. How is this prevented?  Do not share food, drinking cups, or personal items. They can cause the germs to spread. Wash your hands well with soap and water. Make sure that all people in your house wash their hands well. Have family members tested if they have a fever or a sore throat. They may need an antibiotic if they have strep throat. Contact a doctor if: You have swelling in your neck that keeps getting bigger. You get a rash, cough, or earache. You cough up a thick fluid that is green, yellow-brown, or bloody. You have pain that does not get better with medicine. Your symptoms get worse instead of getting better. You have a fever. Get help right away if: You vomit. You have a very bad headache. Your neck hurts or feels stiff. You have chest pain or are short of breath. You have drooling, very bad throat pain, or changes in your voice. Your neck is swollen, or the skin gets red and tender. Your mouth is dry, or you are peeing less than normal. You keep feeling more tired or have trouble waking up. Your joints are red or painful. These symptoms may be an emergency. Do not wait to see if the symptoms will go away. Get help right away. Call   your local emergency services (911 in the [place] Summary Strep throat is an infection of the throat. It is caused by germs (bacteria). This infection can spread from person to person through coughing, sneezing, or having close contact. Take your medicines, including antibiotics, as told by your doctor. Do not stop taking the antibiotic even if you start to feel better. To prevent the spread of germs, wash your hands well with soap and water. Have others do the same. Do not share food, drinking cups,  or personal items. Get help right away if you have a bad headache, chest pain, shortness of breath, a stiff or painful neck, or you vomit. This information is not intended to replace advice given to you by your health care provider. Make sure you discuss any questions you have with your health care provider. Document Revised: 09/19/2020 Document Reviewed: 09/19/2020 Elsevier Patient Education  2023 [employer] Inc[employer]  

## 2021-10-30 ENCOUNTER — Telehealth: Payer: Self-pay

## 2021-10-30 NOTE — Telephone Encounter (Signed)
Copied from CRM [id number]. Topic: General - Other >> Oct 24, 2021  4:32 PM Wyonia Hough E wrote: Reason for CRM: Pt had E visit today and needs a note for missing work today / Pt is ok with this being placed in his mychart /please advise

## 2021-10-30 NOTE — Telephone Encounter (Signed)
Work note written for 10/24/21 return 10/25/21  Saralyn Pilar, DO [facility] 10/30/2021, 4:09 PM

## 2021-11-06 ENCOUNTER — Encounter: Payer: Self-pay | Admitting: Family Medicine

## 2021-11-06 ENCOUNTER — Ambulatory Visit (INDEPENDENT_AMBULATORY_CARE_PROVIDER_SITE_OTHER): Payer: BC Managed Care – PPO | Admitting: Family Medicine

## 2021-11-06 VITALS — BP 112/64 | HR 70 | Ht 69.0 in | Wt 153.8 lb

## 2021-11-06 DIAGNOSIS — E781 Pure hyperglyceridemia: Secondary | ICD-10-CM | POA: Diagnosis not present

## 2021-11-06 DIAGNOSIS — Z1159 Encounter for screening for other viral diseases: Secondary | ICD-10-CM

## 2021-11-06 DIAGNOSIS — J3089 Other allergic rhinitis: Secondary | ICD-10-CM | POA: Diagnosis not present

## 2021-11-06 DIAGNOSIS — Z0000 Encounter for general adult medical examination without abnormal findings: Secondary | ICD-10-CM | POA: Diagnosis not present

## 2021-11-06 DIAGNOSIS — R7301 Impaired fasting glucose: Secondary | ICD-10-CM

## 2021-11-06 LAB — CBC WITH DIFFERENTIAL/PLATELET
Eosinophils Relative: 1.8 %
HCT: 44.9 % (ref 38.5–50.0)
Hemoglobin: 15.3 g/dL (ref 13.2–17.1)
MCH: 30.8 pg (ref 27.0–33.0)
MCHC: 34.1 g/dL (ref 32.0–36.0)
MPV: 10.4 fL (ref 7.5–12.5)
Total Lymphocyte: 16.8 %

## 2021-11-06 NOTE — Progress Notes (Signed)
Subjective:    Patient ID: Chris Mcbride, male    DOB: Aug 06, 1990, 31 y.o.   MRN: [id number]  Chris Mcbride is a 31 y.o. male presenting on 11/06/2021 for Annual Exam   HPI  Here for Annual Physical and Lab Review.   Wellness / Lifestyle - He is doing well. No new concerns since last visit. His urination has mostly improved no new concerns. - Hypertriglyceridemia - previously elevated TG >170 on last lab, fasting, glucose 103 impaired slightly. Fam history T2DM - Diet: Balanced, meats proteins, starches, goal to improve veggie portions, limited breakfast, with protein shake in AM, light lunch, and main meal is dinner. - Exercise: Previously active at gym 4-5 days a week. 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    Admits some Inattentive occasional difficulty focusing. But not impacting his daily function.   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 No further problem. Not on any additional bowel regimen. He has looser stools regularly.   Vaping Former smoker, only temporarily     Health Maintenance:  Prostate CA Screening: No prior prostate CA screening. He has family history father with dx prostate cancer age 48-50 approximately, treated by Dr Yves Dill, had additional specialized procedure, doing well. - he has not decided to pursue any Urology yet.       11/29/2019    9:07 AM 10/25/2019   10:43 AM  Depression screen PHQ 2/9  Decreased Interest 0 0  Down, Depressed, Hopeless 0 0  PHQ - 2 Score 0 0    Past Medical History:  Diagnosis Date   Allergy    GERD (gastroesophageal reflux disease)    Past Surgical History:  Procedure Laterality Date   LAPAROSCOPIC ENDO-RECTAL PULL THROUGH FOR HIRSCHSPRUNG'S DISEASE     Social History   Socioeconomic History   Marital status: Single    Spouse name: Not on  file   Number of children: Not on file   Years of education: High School   Highest education level: High school graduate  Occupational History   Occupation: Delivery Driver  Tobacco Use   Smoking status: Never   Smokeless tobacco: Never   Tobacco comments:    Vape--from 3 years   Vaping Use   Vaping Use: Every day   Start date: 10/08/2016  Substance and Sexual Activity   Alcohol use: Yes    Alcohol/week: 6.0 standard drinks    Types: 6 Cans of beer per week   Drug use: Not Currently    Types: Marijuana   Sexual activity: Not on file  Other Topics Concern   Not on file  Social History Narrative   Not on file   Social Determinants of Health   Financial Resource Strain: Not on file  Food Insecurity: Not on file  Transportation Needs: Not on file  Physical Activity: Not on file  Stress: Not on file  Social Connections: Not on file  Intimate Partner Violence: Not on file   Family History  Problem Relation Age of Onset   Cancer Mother        breast   Breast cancer Mother    Stroke Father    Diabetes Father    Cancer Father        prostate   Prostate cancer Father    Irregular heart beat Sister  heart monitor, palpitation, no clear diagnosis   Current Outpatient Medications on File Prior to Visit  Medication Sig   fluticasone (FLONASE) 50 MCG/ACT nasal spray PLACE 2 SPRAYS INTO BOTH NOSTRILS DAILY. USE FOR 4-6 WEEKS THEN STOP AND USE SEASONALLY OR AS NEEDED.   loratadine (CLARITIN) 10 MG tablet Take 10 mg by mouth daily.   omeprazole (PRILOSEC) 20 MG capsule TAKE 1 CAPSULE BY MOUTH EVERY DAY BEFORE BREAKFAST   No current facility-administered medications on file prior to visit.    Review of Systems  Constitutional:  Negative for activity change, appetite change, chills, diaphoresis, fatigue and fever.  HENT:  Negative for congestion and hearing loss.   Eyes:  Negative for visual disturbance.  Respiratory:  Negative for cough, chest tightness, shortness of  breath and wheezing.   Cardiovascular:  Negative for chest pain, palpitations and leg swelling.  Gastrointestinal:  Negative for abdominal pain, constipation, diarrhea, nausea and vomiting.  Genitourinary:  Negative for dysuria, frequency and hematuria.  Musculoskeletal:  Negative for arthralgias and neck pain.  Skin:  Negative for rash.  Neurological:  Negative for dizziness, weakness, light-headedness, numbness and headaches.  Hematological:  Negative for adenopathy.  Psychiatric/Behavioral:  Negative for behavioral problems, dysphoric mood and sleep disturbance.   Per HPI unless specifically indicated above     Objective:    BP 112/64   Pulse 70   Ht 5\' 9"  (1.753 m)   Wt 153 lb 12.8 oz (69.8 kg)   SpO2 100%   BMI 22.71 kg/m   Wt Readings from Last 3 Encounters:  11/06/21 153 lb 12.8 oz (69.8 kg)  07/05/21 157 lb (71.2 kg)  06/26/21 154 lb (69.9 kg)    Physical Exam Vitals and nursing note reviewed.  Constitutional:      General: He is not in acute distress.    Appearance: He is well-developed. He is not diaphoretic.     Comments: Well-appearing, comfortable, cooperative  HENT:     Head: Normocephalic and atraumatic.  Eyes:     General:        Right eye: No discharge.        Left eye: No discharge.     Conjunctiva/sclera: Conjunctivae normal.     Pupils: Pupils are equal, round, and reactive to light.  Neck:     Thyroid: No thyromegaly.  Cardiovascular:     Rate and Rhythm: Normal rate and regular rhythm.     Pulses: Normal pulses.     Heart sounds: Normal heart sounds. No murmur heard. Pulmonary:     Effort: Pulmonary effort is normal. No respiratory distress.     Breath sounds: Normal breath sounds. No wheezing or rales.  Abdominal:     General: Bowel sounds are normal. There is no distension.     Palpations: Abdomen is soft. There is no mass.     Tenderness: There is no abdominal tenderness.  Musculoskeletal:        General: No tenderness. Normal range of  motion.     Cervical back: Normal range of motion and neck supple.     Comments: Upper / Lower Extremities: - Normal muscle tone, strength bilateral upper extremities 5/5, lower extremities 5/5  Lymphadenopathy:     Cervical: No cervical adenopathy.  Skin:    General: Skin is warm and dry.     Findings: Lesion (left anterior chest 1-2 cm scar tissue from excision of nodule.) present. No erythema or rash.  Neurological:     Mental Status: He is  alert and oriented to person, place, and time.     Comments: Distal sensation intact to light touch all extremities  Psychiatric:        Mood and Affect: Mood normal.        Behavior: Behavior normal.        Thought Content: Thought content normal.     Comments: Well groomed, good eye contact, normal speech and thoughts     Results for orders placed or performed in visit on 11/15/19  HIV Antibody (routine testing w rflx)  Result Value Ref Range   HIV 1&2 Ab, 4th Generation NON-REACTIVE NON-REACTI  Lipid panel  Result Value Ref Range   Cholesterol 183 <200 mg/dL   HDL 68 > OR = 40 mg/dL   Triglycerides 172 (H) <150 mg/dL   LDL Cholesterol (Calc) 88 mg/dL (calc)   Total CHOL/HDL Ratio 2.7 <5.0 (calc)   Non-HDL Cholesterol (Calc) 115 <130 mg/dL (calc)  COMPLETE METABOLIC PANEL WITH GFR  Result Value Ref Range   Glucose, Bld 103 (H) 65 - 99 mg/dL   BUN 16 7 - 25 mg/dL   Creat 0.91 0.60 - 1.35 mg/dL   GFR, Est Non African American 116 > OR = 60 mL/min/1.56m2   GFR, Est African American 134 > OR = 60 mL/min/1.26m2   BUN/Creatinine Ratio NOT APPLICABLE 6 - 22 (calc)   Sodium 140 135 - 146 mmol/L   Potassium 4.5 3.5 - 5.3 mmol/L   Chloride 107 98 - 110 mmol/L   CO2 27 20 - 32 mmol/L   Calcium 9.1 8.6 - 10.3 mg/dL   Total Protein 6.2 6.1 - 8.1 g/dL   Albumin 4.3 3.6 - 5.1 g/dL   Globulin 1.9 1.9 - 3.7 g/dL (calc)   AG Ratio 2.3 1.0 - 2.5 (calc)   Total Bilirubin 0.2 0.2 - 1.2 mg/dL   Alkaline phosphatase (APISO) 51 36 - 130 U/L   AST  12 10 - 40 U/L   ALT 17 9 - 46 U/L  CBC with Differential/Platelet  Result Value Ref Range   WBC 5.2 3.8 - 10.8 Thousand/uL   RBC 4.83 4.20 - 5.80 Million/uL   Hemoglobin 14.8 13.2 - 17.1 g/dL   HCT 45.5 38.5 - 50.0 %   MCV 94.2 80.0 - 100.0 fL   MCH 30.6 27.0 - 33.0 pg   MCHC 32.5 32.0 - 36.0 g/dL   RDW 12.2 11.0 - 15.0 %   Platelets 184 140 - 400 Thousand/uL   MPV 10.3 7.5 - 12.5 fL   Neutro Abs 3,676 1,500 - 7,800 cells/uL   Lymphs Abs 1,024 850 - 3,900 cells/uL   Absolute Monocytes 385 200 - 950 cells/uL   Eosinophils Absolute 73 15 - 500 cells/uL   Basophils Absolute 42 0 - 200 cells/uL   Neutrophils Relative % 70.7 %   Total Lymphocyte 19.7 %   Monocytes Relative 7.4 %   Eosinophils Relative 1.4 %   Basophils Relative 0.8 %  Hemoglobin A1c  Result Value Ref Range   Hgb A1c MFr Bld 4.9 <5.7 % of total Hgb   Mean Plasma Glucose 94 (calc)   eAG (mmol/L) 5.2 (calc)      Assessment & Plan:   Problem List Items Addressed This Visit     Hypertriglyceridemia   Relevant Orders   COMPLETE METABOLIC PANEL WITH GFR   Lipid panel   Environmental and seasonal allergies   Allergic rhinitis due to allergen   Other Visit Diagnoses  Annual physical exam    -  Primary   Relevant Orders   COMPLETE METABOLIC PANEL WITH GFR   CBC with Differential/Platelet   Lipid panel   Hemoglobin A1c   Need for hepatitis C screening test       Impaired fasting glucose       Relevant Orders   Hemoglobin A1c       Updated Health Maintenance information Fasting lab panel today, pending results. Encouraged improvement to lifestyle with diet and exercise Goal of weight loss  Mild elevated Triglyceride last time. This is from starches carbs sugars etc, goal to limit these in diet.  Future prostate cancer testing age 30+ is reasonable. Fam history Father. Age 39 approx  Orders Placed This Encounter  Procedures   COMPLETE METABOLIC PANEL WITH GFR   CBC with Differential/Platelet    Lipid panel    Order Specific Question:   Has the patient fasted?    Answer:   Yes   Hemoglobin A1c      No orders of the defined types were placed in this encounter.    Follow up plan: Return in about 1 year (around 11/07/2022) for 1 year Annual Physical AM apt fasting lab AFTER.  Nobie Putnam, DO [facility] 11/06/2021, 9:05 AM

## 2021-11-06 NOTE — Patient Instructions (Addendum)
Thank you for coming to the office today.  Fasting labs today, stay tuned for results.  Check w insurance on the Screening for Hepatitis C code - Z11.59  Mild elevated Triglyceride last time. This is from starches carbs sugars etc, goal to limit these in diet.  Future prostate cancer testing age 29+ is reasonable.  Please schedule a Follow-up Appointment to: Return in about 1 year (around 11/07/2022) for 1 year Annual Physical AM apt fasting lab AFTER.  If you have any other questions or concerns, please feel free to call the office or send a message through MyChart. You may also schedule an earlier appointment if necessary.  Additionally, you may be receiving a survey about your experience at our office within a few days to 1 week by e-mail or mail. We value your feedback.  Saralyn Pilar, DO [facility], [facility]  ------------------------  Knee tendinitis or Patellofemoral pain syndrome   START anti inflammatory topical - OTC Voltaren (generic Diclofenac) topical 2-4 times a day as needed for pain swelling etc, for 1-2 weeks or longer.   - It is safe to take Tylenol Ext Str 500mg  tabs - take 1 to 2 (max dose 1000mg ) every 6 hours as needed for breakthrough pain, max 24 hour daily dose is 6 to 8 tablets or 4000mg    Use RICE therapy: - R - Rest / relative rest with activity modification avoid overuse and frequent bending or pressure on bent knee - I - Ice packs (make sure you use a towel or sock / something to protect skin) - C - Compression with flexible Knee Sleeve to apply pressure and reduce swelling allowing more support - E - Elevation - if significant swelling, lift leg above heart level (toes above your nose) to help reduce swelling, most helpful at night after day of being on your feet     You can stretch your leg right away by doing the first 3 stretching exercises. Start strengthening your leg by doing the last 3 exercises.  Hamstring stretch on wall:  Lie on your back with your buttocks close to a doorway, and extend your legs straight out in front of you along the floor. Raise the injured leg and rest it against the wall next to the door frame. Your other leg should extend through the doorway. You should feel a stretch in the back of your thigh. Hold this position for 15 to 30 seconds. Repeat 3 times.  Standing calf stretch: Facing a wall, put your hands against the wall at about eye level. Keep the injured leg back, the uninjured leg forward, and the heel of your injured leg on the floor. Turn your injured foot slightly inward (as if you were pigeon-toed) as you slowly lean into the wall until you feel a stretch in the back of your calf. Hold for 15 to 30 seconds. Repeat 3 times. Do this exercise several times each day.  Quadriceps stretch: Stand an arm's length away from the wall, facing straight ahead. Brace yourself by keeping the hand on the uninjured side against the wall. With your other hand, grasp the ankle of the injured leg and pull your heel toward your buttocks. Don't arch or twist your back and keep your knees together. Hold this stretch for 15 to 30 seconds. Repeat 3 times.  Quadriceps isometrics: Sitting on the floor with your injured leg straight and your other leg bent, press the back of your knee into the floor by tightening the muscles  on the top of your thigh. Hold this position 10 seconds. Relax. Do 3 sets of 10.  Heel slide: Sit on a firm surface with your legs straight in front of you. Slowly slide the heel of your injured leg toward your buttock by pulling your knee to your chest as you slide. Return to the starting position. Do 3 sets of 10.  Hamstring isometrics: Sitting on the floor with the injured leg slightly bent, dig the heel of your injured leg into the floor and tighten up the back of your thigh muscles. Hold this position for 5 seconds. Do 3 sets of 10.

## 2021-11-07 LAB — CBC WITH DIFFERENTIAL/PLATELET
Absolute Monocytes: 330 cells/uL (ref 200–950)
Basophils Absolute: 42 cells/uL (ref 0–200)
Basophils Relative: 0.7 %
Eosinophils Absolute: 108 cells/uL (ref 15–500)
Lymphs Abs: 1008 cells/uL (ref 850–3900)
MCV: 90.5 fL (ref 80.0–100.0)
Monocytes Relative: 5.5 %
Neutro Abs: 4512 cells/uL (ref 1500–7800)
Neutrophils Relative %: 75.2 %
Platelets: 238 10*3/uL (ref 140–400)
RBC: 4.96 10*6/uL (ref 4.20–5.80)
RDW: 12.5 % (ref 11.0–15.0)
WBC: 6 10*3/uL (ref 3.8–10.8)

## 2021-11-07 LAB — LIPID PANEL
Cholesterol: 210 mg/dL — ABNORMAL HIGH (ref <200)
HDL: 69 mg/dL (ref >=40)
LDL Cholesterol (Calc): 113 mg/dL (calc) — ABNORMAL HIGH
Non-HDL Cholesterol (Calc): 141 mg/dL (calc) — ABNORMAL HIGH (ref <130)
Total CHOL/HDL Ratio: 3 (calc) (ref <5.0)
Triglycerides: 163 mg/dL — ABNORMAL HIGH (ref <150)

## 2021-11-07 LAB — COMPLETE METABOLIC PANEL WITH GFR
AG Ratio: 1.9 (calc) (ref 1.0–2.5)
ALT: 17 U/L (ref 9–46)
AST: 15 U/L (ref 10–40)
Albumin: 4.6 g/dL (ref 3.6–5.1)
Alkaline phosphatase (APISO): 64 U/L (ref 36–130)
BUN: 14 mg/dL (ref 7–25)
CO2: 28 mmol/L (ref 20–32)
Calcium: 9.7 mg/dL (ref 8.6–10.3)
Chloride: 103 mmol/L (ref 98–110)
Creat: 0.97 mg/dL (ref 0.60–1.24)
Globulin: 2.4 g/dL (calc) (ref 1.9–3.7)
Glucose, Bld: 84 mg/dL (ref 65–139)
Potassium: 4.3 mmol/L (ref 3.5–5.3)
Sodium: 141 mmol/L (ref 135–146)
Total Bilirubin: 0.5 mg/dL (ref 0.2–1.2)
Total Protein: 7 g/dL (ref 6.1–8.1)
eGFR: 109 mL/min/{1.73_m2} (ref >=60)

## 2021-11-07 LAB — HEMOGLOBIN A1C
Hgb A1c MFr Bld: 5.1 % of total Hgb (ref <5.7)
Mean Plasma Glucose: 100 mg/dL
eAG (mmol/L): 5.5 mmol/L

## 2022-02-01 ENCOUNTER — Other Ambulatory Visit: Payer: Self-pay | Admitting: Family Medicine

## 2022-02-01 DIAGNOSIS — J0110 Acute frontal sinusitis, unspecified: Secondary | ICD-10-CM

## 2022-02-01 DIAGNOSIS — K219 Gastro-esophageal reflux disease without esophagitis: Secondary | ICD-10-CM

## 2022-02-01 NOTE — Telephone Encounter (Signed)
Requested Prescriptions  Pending Prescriptions Disp Refills  . fluticasone (FLONASE) 50 MCG/ACT nasal spray [Pharmacy Med Name: FLUTICASONE PROP 50 MCG SPRAY] 48 mL 0    Sig: PLACE 2 SPRAYS INTO BOTH NOSTRILS DAILY. USE FOR 4-6 WEEKS THEN STOP AND USE SEASONALLY OR AS NEEDED     Ear, Nose, and Throat: Nasal Preparations - Corticosteroids Passed - 02/01/2022  2:16 AM      Passed - Valid encounter within last 12 months    Recent Outpatient Visits          2 months ago Annual physical exam   [facility] Smitty Cords, DO   3 months ago Pharyngitis, unspecified etiology   [facility] [place], Salvadore Oxford, NP   1 year ago Acute non-recurrent frontal sinusitis   [facility] Smitty Cords, DO   1 year ago Diarrhea, unspecified type   [facility] [place], Netta Neat, DO   2 years ago Annual physical exam   [facility], Netta Neat, DO             . omeprazole (PRILOSEC) 20 MG capsule [Pharmacy Med Name: OMEPRAZOLE DR 20 MG CAPSULE] 90 capsule 0    Sig: TAKE 1 CAPSULE BY MOUTH EVERY DAY BEFORE BREAKFAST     Gastroenterology: Proton Pump Inhibitors Passed - 02/01/2022  2:16 AM      Passed - Valid encounter within last 12 months    Recent Outpatient Visits          2 months ago Annual physical exam   [facility] Smitty Cords, DO   3 months ago Pharyngitis, unspecified etiology   [facility] [place], Salvadore Oxford, NP   1 year ago Acute non-recurrent frontal sinusitis   [facility] Smitty Cords, DO   1 year ago Diarrhea, unspecified type   [facility] Smitty Cords, DO   2 years ago Annual physical exam   [facility] [place], Netta Neat, DO

## 2022-04-30 ENCOUNTER — Other Ambulatory Visit: Payer: Self-pay | Admitting: Family Medicine

## 2022-04-30 DIAGNOSIS — K219 Gastro-esophageal reflux disease without esophagitis: Secondary | ICD-10-CM

## 2022-04-30 NOTE — Telephone Encounter (Signed)
Requested Prescriptions  Pending Prescriptions Disp Refills   omeprazole (PRILOSEC) 20 MG capsule [Pharmacy Med Name: OMEPRAZOLE DR 20 MG CAPSULE] 90 capsule 1    Sig: TAKE 1 CAPSULE BY MOUTH EVERY DAY BEFORE BREAKFAST     Gastroenterology: Proton Pump Inhibitors Passed - 04/30/2022  2:01 AM      Passed - Valid encounter within last 12 months    Recent Outpatient Visits           5 months ago Annual physical exam   [facility] Smitty Cords, DO   6 months ago Pharyngitis, unspecified etiology   [facility] [place], Salvadore Oxford, NP   1 year ago Acute non-recurrent frontal sinusitis   [facility] Smitty Cords, DO   1 year ago Diarrhea, unspecified type   [facility], Netta Neat, DO   2 years ago Annual physical exam   [facility] [place], Netta Neat, DO

## 2022-05-01 ENCOUNTER — Other Ambulatory Visit: Payer: Self-pay | Admitting: Family Medicine

## 2022-05-01 DIAGNOSIS — J0110 Acute frontal sinusitis, unspecified: Secondary | ICD-10-CM

## 2022-05-01 NOTE — Telephone Encounter (Signed)
Requested Prescriptions  Pending Prescriptions Disp Refills   fluticasone (FLONASE) 50 MCG/ACT nasal spray [Pharmacy Med Name: FLUTICASONE PROP 50 MCG SPRAY] 48 mL 0    Sig: PLACE 2 SPRAYS INTO BOTH NOSTRILS DAILY. USE FOR 4-6 WEEKS THEN STOP AND USE SEASONALLY OR AS NEEDED     Ear, Nose, and Throat: Nasal Preparations - Corticosteroids Passed - 05/01/2022  1:49 AM      Passed - Valid encounter within last 12 months    Recent Outpatient Visits           5 months ago Annual physical exam   [facility] [place], Netta Neat, DO   6 months ago Pharyngitis, unspecified etiology   [facility] [place], Salvadore Oxford, NP   1 year ago Acute non-recurrent frontal sinusitis   [facility] Smitty Cords, DO   1 year ago Diarrhea, unspecified type   [facility] Smitty Cords, DO   2 years ago Annual physical exam   [facility] Smitty Cords, DO

## 2022-07-29 ENCOUNTER — Other Ambulatory Visit: Payer: Self-pay | Admitting: Family Medicine

## 2022-07-29 DIAGNOSIS — J0110 Acute frontal sinusitis, unspecified: Secondary | ICD-10-CM

## 2022-07-30 NOTE — Telephone Encounter (Signed)
Requested medication (s) are due for refill today: yes   Requested medication (s) are on the active medication list: yes  Last refill:  05/01/22 #48 ml 0 refills   Future visit scheduled: no   Notes to clinic:  no refills remain. Do you want to refill Rx?     Requested Prescriptions  Pending Prescriptions Disp Refills   fluticasone (FLONASE) 50 MCG/ACT nasal spray [Pharmacy Med Name: FLUTICASONE PROP 50 MCG SPRAY] 48 mL 0    Sig: PLACE 2 SPRAYS INTO BOTH NOSTRILS DAILY. USE FOR 4-6 WEEKS THEN STOP AND USE SEASONALLY OR AS NEEDED     Ear, Nose, and Throat: Nasal Preparations - Corticosteroids Passed - 07/29/2022  1:25 AM      Passed - Valid encounter within last 12 months    Recent Outpatient Visits           8 months ago Annual physical exam   [facility] O[facility] Hauser, DO   9 months ago Pharyngitis, unspecified etiology   [facility] [place], Coralie Keens, NP   1 year ago Acute non-recurrent frontal sinusitis   [facility] Olin Hauser, DO   2 years ago Diarrhea, unspecified type   [facility] Olin Hauser, DO   2 years ago Annual physical exam   [facility] [place], Devonne Doughty, [place]

## 2022-10-20 ENCOUNTER — Other Ambulatory Visit: Payer: Self-pay | Admitting: Family Medicine

## 2022-10-20 DIAGNOSIS — K219 Gastro-esophageal reflux disease without esophagitis: Secondary | ICD-10-CM

## 2022-10-21 NOTE — Telephone Encounter (Signed)
Patient will need an office visit for further refills. Requested Prescriptions  Pending Prescriptions Disp Refills   omeprazole (PRILOSEC) 20 MG capsule [Pharmacy Med Name: OMEPRAZOLE DR 20 MG CAPSULE] 90 capsule 0    Sig: TAKE 1 CAPSULE BY MOUTH EVERY DAY BEFORE BREAKFAST     Gastroenterology: Proton Pump Inhibitors Passed - 10/20/2022  9:17 AM      Passed - Valid encounter within last 12 months    Recent Outpatient Visits           11 months ago Annual physical exam   [facility] Smitty Cords, DO   12 months ago Pharyngitis, unspecified etiology   [facility] [place], Salvadore Oxford, NP   1 year ago Acute non-recurrent frontal sinusitis   [facility] Smitty Cords, DO   2 years ago Diarrhea, unspecified type   [facility] Smitty Cords, DO   2 years ago Annual physical exam   [facility] [place], Netta Neat, [place]

## 2022-12-24 ENCOUNTER — Encounter: Payer: Self-pay | Admitting: Family Medicine

## 2022-12-24 ENCOUNTER — Ambulatory Visit (INDEPENDENT_AMBULATORY_CARE_PROVIDER_SITE_OTHER): Payer: BC Managed Care – PPO | Admitting: Family Medicine

## 2022-12-24 VITALS — BP 110/68 | HR 62 | Ht 69.0 in | Wt 173.0 lb

## 2022-12-24 DIAGNOSIS — Z0000 Encounter for general adult medical examination without abnormal findings: Secondary | ICD-10-CM

## 2022-12-24 DIAGNOSIS — Z1159 Encounter for screening for other viral diseases: Secondary | ICD-10-CM | POA: Diagnosis not present

## 2022-12-24 DIAGNOSIS — K219 Gastro-esophageal reflux disease without esophagitis: Secondary | ICD-10-CM

## 2022-12-24 DIAGNOSIS — R7301 Impaired fasting glucose: Secondary | ICD-10-CM

## 2022-12-24 DIAGNOSIS — E781 Pure hyperglyceridemia: Secondary | ICD-10-CM | POA: Diagnosis not present

## 2022-12-24 MED ORDER — OMEPRAZOLE 20 MG PO CPDR: 20.0000 mg | DELAYED_RELEASE_CAPSULE | Freq: Every day | ORAL | 3 refills | Status: DC

## 2022-12-24 NOTE — Progress Notes (Unsigned)
Subjective:    Patient ID: Chris Mcbride, male    DOB: [birth year], 32 y.o.   MRN: [id number]  Chris Mcbride is a 32 y.o. male presenting on 12/24/2022 for Annual Exam   HPI  Here for Annual Physical and Lab Review.   Wellness / Lifestyle - He is doing well. Prior HyperTG 170 > 160s due for lab upcoming Prior A1c 5.1, Fam history T2DM - Diet: Balanced - Exercise: Previously active at gym 4-5 days a week. 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 - He had L hand fracture injury 06/2022 - 08/2022, he was more sedentary less gym or activity.   GERD Improved on Omperazole 20mg  daily Needs re order.  Lasik eye surgery Feb 2024, doing well.  Admits some Inattentive occasional difficulty focusing. But not impacting his daily function.   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 No further problem. Not on any additional bowel regimen. He has looser stools regularly.   Vaping Former smoker, only temporarily     Health Maintenance:  Prostate CA Screening: No prior prostate CA screening. He has family history father with dx prostate cancer age 60-50 approximately, treated by Dr Evelene Croon, had additional specialized procedure, doing well. - he has not decided to pursue any Urology yet.      12/24/2022    3:07 PM 11/29/2019    9:07 AM 10/25/2019   10:43 AM  Depression screen PHQ 2/9  Decreased Interest 0 0 0  Down, Depressed, Hopeless 0 0 0  PHQ - 2 Score 0 0 0    Past Medical History:  Diagnosis Date   Allergy    GERD (gastroesophageal reflux disease)    Past Surgical History:  Procedure Laterality Date   LAPAROSCOPIC ENDO-RECTAL PULL THROUGH FOR HIRSCHSPRUNG'S DISEASE     Social History   Socioeconomic History   Marital status: Single    Spouse name: Not on file   Number of children: Not on file   Years of  education: High School   Highest education level: High school graduate  Occupational History   Occupation: Delivery Driver  Tobacco Use   Smoking status: Never   Smokeless tobacco: Never   Tobacco comments:    Vape--from 3 years   Vaping Use   Vaping status: Every Day   Start date: 10/08/2016  Substance and Sexual Activity   Alcohol use: Yes    Alcohol/week: 6.0 standard drinks of alcohol    Types: 6 Cans of beer per week   Drug use: Not Currently    Types: Marijuana   Sexual activity: Not on file  Other Topics Concern   Not on file  Social History Narrative   Not on file   Social Determinants of Health   Financial Resource Strain: Not on file  Food Insecurity: Not on file  Transportation Needs: Not on file  Physical Activity: Not on file  Stress: Not on file  Social Connections: Not on file  Intimate Partner Violence: Not on file   Family History  Problem Relation Age of Onset   Cancer Mother        breast   Breast cancer Mother    Stroke Father    Diabetes Father    Cancer Father        prostate   Prostate cancer Father    Irregular heart  beat Sister        heart monitor, palpitation, no clear diagnosis   Current Outpatient Medications on File Prior to Visit  Medication Sig   fluticasone (FLONASE) 50 MCG/ACT nasal spray PLACE 2 SPRAYS INTO BOTH NOSTRILS DAILY. USE FOR 4-6 WEEKS THEN STOP AND USE SEASONALLY OR AS NEEDED   loratadine (CLARITIN) 10 MG tablet Take 10 mg by mouth daily.   Multiple Vitamin (MULTIVITAMIN) tablet Take 1 tablet by mouth daily.   No current facility-administered medications on file prior to visit.    Review of Systems  Constitutional:  Negative for activity change, appetite change, chills, diaphoresis, fatigue and fever.  HENT:  Negative for congestion and hearing loss.   Eyes:  Negative for visual disturbance.  Respiratory:  Negative for cough, chest tightness, shortness of breath and wheezing.   Cardiovascular:  Negative for chest  pain, palpitations and leg swelling.  Gastrointestinal:  Negative for abdominal pain, constipation, diarrhea, nausea and vomiting.  Genitourinary:  Negative for dysuria, frequency and hematuria.  Musculoskeletal:  Negative for arthralgias and neck pain.  Skin:  Negative for rash.  Neurological:  Negative for dizziness, weakness, light-headedness, numbness and headaches.  Hematological:  Negative for adenopathy.  Psychiatric/Behavioral:  Negative for behavioral problems, dysphoric mood and sleep disturbance.    Per HPI unless specifically indicated above      Objective:    BP 110/68   Pulse 62   Ht 5\' 9"  (1.753 m)   Wt 173 lb (78.5 kg)   SpO2 98%   BMI 25.55 kg/m   Wt Readings from Last 3 Encounters:  12/24/22 173 lb (78.5 kg)  11/06/21 153 lb 12.8 oz (69.8 kg)  07/05/21 157 lb (71.2 kg)    Physical Exam Vitals and nursing note reviewed.  Constitutional:      General: He is not in acute distress.    Appearance: He is well-developed. He is not diaphoretic.     Comments: Well-appearing, comfortable, cooperative  HENT:     Head: Normocephalic and atraumatic.     Right Ear: Tympanic membrane, ear canal and external ear normal. There is no impacted cerumen.     Left Ear: Tympanic membrane, ear canal and external ear normal. There is no impacted cerumen.  Eyes:     General:        Right eye: No discharge.        Left eye: No discharge.     Conjunctiva/sclera: Conjunctivae normal.     Pupils: Pupils are equal, round, and reactive to light.  Neck:     Thyroid: No thyromegaly.     Vascular: No carotid bruit.  Cardiovascular:     Rate and Rhythm: Normal rate and regular rhythm.     Pulses: Normal pulses.     Heart sounds: Normal heart sounds. No murmur heard. Pulmonary:     Effort: Pulmonary effort is normal. No respiratory distress.     Breath sounds: Normal breath sounds. No wheezing or rales.  Abdominal:     General: Bowel sounds are normal. There is no distension.      Palpations: Abdomen is soft. There is no mass.     Tenderness: There is no abdominal tenderness.  Musculoskeletal:        General: No tenderness. Normal range of motion.     Cervical back: Normal range of motion and neck supple.     Right lower leg: No edema.     Left lower leg: No edema.     Comments:  Upper / Lower Extremities: - Normal muscle tone, strength bilateral upper extremities 5/5, lower extremities 5/5  Lymphadenopathy:     Cervical: No cervical adenopathy.  Skin:    General: Skin is warm and dry.     Findings: No erythema or rash.  Neurological:     Mental Status: He is alert and oriented to person, place, and time.     Comments: Distal sensation intact to light touch all extremities  Psychiatric:        Mood and Affect: Mood normal.        Behavior: Behavior normal.        Thought Content: Thought content normal.     Comments: Well groomed, good eye contact, normal speech and thoughts      Results for orders placed or performed in visit on 11/06/21  COMPLETE METABOLIC PANEL WITH GFR  Result Value Ref Range   Glucose, Bld 84 65 - 139 mg/dL   BUN 14 7 - 25 mg/dL   Creat 6.04 5.40 - 9.81 mg/dL   eGFR 191 > OR = 60 YN/WGN/[id number]   BUN/Creatinine Ratio NOT APPLICABLE 6 - 22 (calc)   Sodium 141 135 - 146 mmol/L   Potassium 4.3 3.5 - 5.3 mmol/L   Chloride 103 98 - 110 mmol/L   CO2 28 20 - 32 mmol/L   Calcium 9.7 8.6 - 10.3 mg/dL   Total Protein 7.0 6.1 - 8.1 g/dL   Albumin 4.6 3.6 - 5.1 g/dL   Globulin 2.4 1.9 - 3.7 g/dL (calc)   AG Ratio 1.9 1.0 - 2.5 (calc)   Total Bilirubin 0.5 0.2 - 1.2 mg/dL   Alkaline phosphatase (APISO) 64 36 - 130 U/L   AST 15 10 - 40 U/L   ALT 17 9 - 46 U/L  CBC with Differential/Platelet  Result Value Ref Range   WBC 6.0 3.8 - 10.8 Thousand/uL   RBC 4.96 4.20 - 5.80 Million/uL   Hemoglobin 15.3 13.2 - 17.1 g/dL   HCT 08.6 57.8 - 46.9 %   MCV 90.5 80.0 - 100.0 fL   MCH 30.8 27.0 - 33.0 pg   MCHC 34.1 32.0 - 36.0 g/dL   RDW 62.9  52.8 - 41.3 %   Platelets 238 140 - 400 Thousand/uL   MPV 10.4 7.5 - 12.5 fL   Neutro Abs 4,512 1,500 - 7,800 cells/uL   Lymphs Abs 1,008 850 - 3,900 cells/uL   Absolute Monocytes 330 200 - 950 cells/uL   Eosinophils Absolute 108 15 - 500 cells/uL   Basophils Absolute 42 0 - 200 cells/uL   Neutrophils Relative % 75.2 %   Total Lymphocyte 16.8 %   Monocytes Relative 5.5 %   Eosinophils Relative 1.8 %   Basophils Relative 0.7 %  Lipid panel  Result Value Ref Range   Cholesterol 210 (H) <200 mg/dL   HDL 69 > OR = 40 mg/dL   Triglycerides 244 (H) <150 mg/dL   LDL Cholesterol (Calc) 113 (H) mg/dL (calc)   Total CHOL/HDL Ratio 3.0 <5.0 (calc)   Non-HDL Cholesterol (Calc) 141 (H) <130 mg/dL (calc)  Hemoglobin W1U  Result Value Ref Range   Hgb A1c MFr Bld 5.1 <5.7 % of total Hgb   Mean Plasma Glucose 100 mg/dL   eAG (mmol/L) 5.5 mmol/L      Assessment & Plan:   Problem List Items Addressed This Visit     Hypertriglyceridemia   Relevant Orders   COMPLETE METABOLIC PANEL WITH GFR   CBC with  Differential/Platelet   Lipid panel   TSH   Other Visit Diagnoses     Annual physical exam    -  Primary   Relevant Orders   COMPLETE METABOLIC PANEL WITH GFR   Hemoglobin A1c   CBC with Differential/Platelet   Need for hepatitis C screening test       Relevant Orders   Hepatitis C antibody   Impaired fasting glucose       Relevant Orders   Hemoglobin A1c   Gastroesophageal reflux disease without esophagitis       Relevant Medications   omeprazole (PRILOSEC) 20 MG capsule       Updated Health Maintenance information Fasting lab orders due. Return 7/25 Encouraged improvement to lifestyle with diet and exercise Goal of weight loss  #GERD Re order PPI Omeprazole 20mg  daily  Orders Placed This Encounter  Procedures   COMPLETE METABOLIC PANEL WITH GFR    Standing Status:   Future    Standing Expiration Date:   05/11/2023   Hemoglobin A1c    Standing Status:   Future     Standing Expiration Date:   05/11/2023   CBC with Differential/Platelet    Standing Status:   Future    Standing Expiration Date:   05/11/2023   Lipid panel    Standing Status:   Future    Standing Expiration Date:   05/11/2023    Order Specific Question:   Has the patient fasted?    Answer:   Yes   Hepatitis C antibody    Standing Status:   Future    Standing Expiration Date:   05/11/2023   TSH    Standing Status:   Future    Standing Expiration Date:   05/11/2023      Meds ordered this encounter  Medications   omeprazole (PRILOSEC) 20 MG capsule    Sig: Take 1 capsule (20 mg total) by mouth daily before breakfast.    Dispense:  90 capsule    Refill:  3     Follow up plan: Return in about 1 year (around 12/24/2023) for 1 year Annual Physical AM apt fasting lab after.  Saralyn Pilar, DO [facility] 12/24/2022, 3:19 PM

## 2022-12-24 NOTE — Patient Instructions (Addendum)
Thank you for coming to the office today.  Refilled Omeprazole 20mg  daily. 90 day with refills.  Labs ordered for next Thursday.  Keep working on lifestyle exercise weight management  +20 lb wt gain in 1 year.  DUE for FASTING BLOOD WORK (no food or drink after midnight before the lab appointment, only water or coffee without cream/sugar on the morning of)  SCHEDULE "Lab Only" visit in the morning at the clinic for lab draw in 7/25 815am  - Make sure Lab Only appointment is at about 1 week before your next appointment, so that results will be available  For Lab Results, once available within 2-3 days of blood draw, you can can log in to MyChart online to view your results and a brief explanation. Also, we can discuss results at next follow-up visit.   Please schedule a Follow-up Appointment to: Return in about 1 year (around 12/24/2023) for 1 year Annual Physical AM apt fasting lab after.  If you have any other questions or concerns, please feel free to call the office or send a message through MyChart. You may also schedule an earlier appointment if necessary.  Additionally, you may be receiving a survey about your experience at our office within a few days to 1 week by e-mail or mail. We value your feedback.  Saralyn Pilar, DO [facility], [place]

## 2023-01-02 ENCOUNTER — Other Ambulatory Visit: Payer: BC Managed Care – PPO

## 2023-01-02 DIAGNOSIS — Z0000 Encounter for general adult medical examination without abnormal findings: Secondary | ICD-10-CM

## 2023-01-02 DIAGNOSIS — R7301 Impaired fasting glucose: Secondary | ICD-10-CM

## 2023-01-02 DIAGNOSIS — E781 Pure hyperglyceridemia: Secondary | ICD-10-CM

## 2023-01-02 DIAGNOSIS — Z1159 Encounter for screening for other viral diseases: Secondary | ICD-10-CM

## 2023-01-02 LAB — CBC WITH DIFFERENTIAL/PLATELET
Absolute Monocytes: 403 cells/uL (ref 200–950)
Basophils Absolute: 52 cells/uL (ref 0–200)
Basophils Relative: 0.8 %
Eosinophils Absolute: 143 cells/uL (ref 15–500)
Eosinophils Relative: 2.2 %
HCT: 46.5 % (ref 38.5–50.0)
Hemoglobin: 15.6 g/dL (ref 13.2–17.1)
MCH: 30.1 pg (ref 27.0–33.0)
MCHC: 33.5 g/dL (ref 32.0–36.0)
MCV: 89.8 fL (ref 80.0–100.0)
MPV: 10.9 fL (ref 7.5–12.5)
Monocytes Relative: 6.2 %
Neutro Abs: 4875 cells/uL (ref 1500–7800)
Neutrophils Relative %: 75 %
Platelets: 189 10*3/uL (ref 140–400)
RDW: 12.6 % (ref 11.0–15.0)
Total Lymphocyte: 15.8 %
WBC: 6.5 10*3/uL (ref 3.8–10.8)

## 2023-07-22 ENCOUNTER — Other Ambulatory Visit: Payer: Self-pay | Admitting: Family Medicine

## 2023-07-22 DIAGNOSIS — J0110 Acute frontal sinusitis, unspecified: Secondary | ICD-10-CM

## 2023-07-22 NOTE — Telephone Encounter (Signed)
Requested Prescriptions  Pending Prescriptions Disp Refills   fluticasone (FLONASE) 50 MCG/ACT nasal spray [Pharmacy Med Name: FLUTICASONE PROP 50 MCG SPRAY] 48 mL 3    Sig: PLACE 2 SPRAYS INTO BOTH NOSTRILS DAILY. USE FOR 4-6 WEEKS THEN STOP AND USE SEASONALLY OR AS NEEDED     Ear, Nose, and Throat: Nasal Preparations - Corticosteroids Passed - 07/22/2023  4:05 PM      Passed - Valid encounter within last 12 months    Recent Outpatient Visits           7 months ago Annual physical exam   [facility] Althea Charon, Netta Neat, DO   1 year ago Annual physical exam   [facility] Smitty Cords, DO   1 year ago Pharyngitis, unspecified etiology   [place] [facility] Lynn, Salvadore Oxford, NP   2 years ago Acute non-recurrent frontal sinusitis   [facility] Smitty Cords, DO   3 years ago Diarrhea, unspecified type   [facility] Charon, Netta Neat, DO       Future Appointments             In 5 months Althea Charon, Netta Neat, DO [place] [facility], [facility]

## 2024-01-07 ENCOUNTER — Ambulatory Visit (INDEPENDENT_AMBULATORY_CARE_PROVIDER_SITE_OTHER): Payer: Self-pay | Admitting: Family Medicine

## 2024-01-07 ENCOUNTER — Encounter: Payer: Self-pay | Admitting: Family Medicine

## 2024-01-07 VITALS — BP 118/70 | HR 88 | Ht 69.0 in | Wt 188.0 lb

## 2024-01-07 DIAGNOSIS — Z0000 Encounter for general adult medical examination without abnormal findings: Secondary | ICD-10-CM

## 2024-01-07 DIAGNOSIS — E781 Pure hyperglyceridemia: Secondary | ICD-10-CM

## 2024-01-07 DIAGNOSIS — R7301 Impaired fasting glucose: Secondary | ICD-10-CM

## 2024-01-07 DIAGNOSIS — Z23 Encounter for immunization: Secondary | ICD-10-CM

## 2024-01-07 DIAGNOSIS — J0110 Acute frontal sinusitis, unspecified: Secondary | ICD-10-CM

## 2024-01-07 DIAGNOSIS — K219 Gastro-esophageal reflux disease without esophagitis: Secondary | ICD-10-CM

## 2024-01-07 DIAGNOSIS — R7989 Other specified abnormal findings of blood chemistry: Secondary | ICD-10-CM

## 2024-01-07 MED ORDER — FLUTICASONE PROPIONATE 50 MCG/ACT NA SUSP: 2.0000 | Freq: Every day | NASAL | 3 refills | Status: AC

## 2024-01-07 MED ORDER — OMEPRAZOLE 20 MG PO CPDR
20.0000 mg | DELAYED_RELEASE_CAPSULE | Freq: Every day | ORAL | 3 refills | Status: AC
Start: 2024-01-07 — End: ?

## 2024-01-07 NOTE — Patient Instructions (Addendum)
 Thank you for coming to the office today.  Labs today stay tuned for results  Refilled medications to [facility]  BP recheck is normal, first one was false alarm.  TDap tetanus vaccine good for 10 years today  Please schedule a Follow-up Appointment to: Return in about 1 year (around 01/06/2025) for 1 year Annual Physical fasting lab after.  If you have any other questions or concerns, please feel free to call the office or send a message through MyChart. You may also schedule an earlier appointment if necessary.  Additionally, you may be receiving a survey about your experience at our office within a few days to 1 week by e-mail or mail. We value your feedback.  Marsa Officer, DO [facility], [place]

## 2024-01-07 NOTE — Progress Notes (Signed)
 Subjective:    Patient ID: Chris Mcbride, male    DOB: Jul 08, 1990, 33 y.o.   MRN: [id number]  Chris Mcbride is a 33 y.o. male presenting on 01/07/2024 for Annual Exam   HPI  Discussed the use of AI scribe software for clinical note transcription with the patient, who gave verbal consent to proceed.  History of Present Illness   Chris Mcbride is a 33 year old male who presents for an annual physical exam and routine vaccinations.  Hyperlipidemia - Elevated cholesterol with increased LDL over the past 4-5 years - Family history of type 2 diabetes and kidney disease - Sister with history of cardiac arrhythmia, status post ablation  Blood pressure evaluation - Repeat blood pressure reading normal at 118/70 - No history of hypertension  Weight gain and physical activity - Recent increase in weight attributed to dietary habits - Physically active, walking 8-10 miles daily as part of occupation with [employer]  Medication management - Uses Flonase  and omeprazole  - Requests renewal of both prescriptions for the year     Elevated TSH Elevated TSH 5.14 on last lab, prior normal or no other results. No significant symptoms or fam history Repeat labs today  Wellness / Lifestyle - He is doing well. Prior HyperTG 160-170 to 340 in past Prior A1c 5.4, Fam history T2DM - Diet: Balanced - Exercise: Previously active at gym 4-5 days a week. 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   GERD Controlled on Omperazole 20mg  daily Needs re order.   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 No further problem. Not on any additional bowel regimen. He has looser stools regularly.   Vaping Former smoker, only temporarily     Health Maintenance:  Prostate CA Screening: No prior prostate CA screening. He has family history father with dx prostate cancer age 96-50 approximately, treated by Dr  Kassie, had additional specialized procedure, doing well. - he has not decided to pursue any Urology yet.      01/07/2024    8:07 AM 12/24/2022    3:07 PM 11/29/2019    9:07 AM  Depression screen PHQ 2/9  Decreased Interest 0 0 0  Down, Depressed, Hopeless 0 0 0  PHQ - 2 Score 0 0 0  Altered sleeping 0    Tired, decreased energy 1    Change in appetite 1    Feeling bad or failure about yourself  0    Trouble concentrating 1    Moving slowly or fidgety/restless 0    Suicidal thoughts 0    PHQ-9 Score 3    Difficult doing work/chores Not difficult at all         01/07/2024    8:08 AM 12/24/2022    3:07 PM  GAD 7 : Generalized Anxiety Score  Nervous, Anxious, on Edge 1 1  Control/stop worrying 0 1  Worry too much - different things 1 0  Trouble relaxing 0 1  Restless 0 1  Easily annoyed or irritable 1 1  Afraid - awful might happen 0 0  Total GAD 7 Score 3 5  Anxiety Difficulty Not difficult at all      Past Medical History:  Diagnosis Date  . Allergy   . GERD (gastroesophageal reflux disease)    Past Surgical History:  Procedure Laterality Date  . LAPAROSCOPIC ENDO-RECTAL PULL THROUGH FOR HIRSCHSPRUNG'S DISEASE  Social History   Socioeconomic History  . Marital status: Single    Spouse name: Not on file  . Number of children: Not on file  . Years of education: [employer]  . Highest education level: GED or equivalent  Occupational History  . Occupation: Civil [employer] fast streamer  Tobacco Use  . Smoking status: Never  . Smokeless tobacco: Never  . Tobacco comments:    Vape--from 3 years   Vaping Use  . Vaping status: Every Day  . Start date: 10/08/2016  Substance and Sexual Activity  . Alcohol use: Yes    Alcohol/week: 6.0 standard drinks of alcohol    Types: 6 Cans of beer per week  . Drug use: Not Currently    Types: Marijuana  . Sexual activity: Not on file  Other Topics Concern  . Not on file  Social History Narrative  . Not on file   Social Drivers  of Health   Financial Resource Strain: Medium Risk (01/03/2024)   Overall Financial Resource Strain (CARDIA)   . Difficulty of Paying Living Expenses: Somewhat hard  Food Insecurity: No Food Insecurity (01/03/2024)   Hunger Vital Sign   . Worried About Programme researcher, broadcasting/film/video in the Last Year: Never true   . Ran Out of Food in the Last Year: Never true  Transportation Needs: No Transportation Needs (01/03/2024)   PRAPARE - Transportation   . Lack of Transportation (Medical): No   . Lack of Transportation (Non-Medical): No  Physical Activity: Sufficiently Active (01/03/2024)   Exercise Vital Sign   . Days of Exercise per Week: 5 days   . Minutes of Exercise per Session: 150+ min  Stress: No Stress Concern Present (01/03/2024)   [employer] of Occupational Health - Occupational Stress Questionnaire   . Feeling of Stress: Only a little  Social Connections: Moderately Integrated (01/03/2024)   Social Connection and Isolation Panel   . Frequency of Communication with Friends and Family: More than three times a week   . Frequency of Social Gatherings with Friends and Family: Once a week   . Attends Religious Services: 1 to 4 times per year   . Active Member of Clubs or Organizations: Yes   . Attends Banker Meetings: 1 to 4 times per year   . Marital Status: Never married  Intimate Partner Violence: Not on file   Family History  Problem Relation Age of Onset  . Cancer Mother        breast  . Breast cancer Mother   . Stroke Father   . Diabetes Father   . Cancer Father        prostate  . Prostate cancer Father   . Other Father        Chronic Kidney Disease  . Irregular heart beat Sister        heart monitor, palpitation, cardiac arrhythmia, s/p ablation   Current Outpatient Medications on File Prior to Visit  Medication Sig  . loratadine (CLARITIN) 10 MG tablet Take 10 mg by mouth daily.  . Multiple Vitamin (MULTIVITAMIN) tablet Take 1 tablet by mouth daily.   No  current facility-administered medications on file prior to visit.    Review of Systems  Constitutional:  Negative for activity change, appetite change, chills, diaphoresis, fatigue and fever.  HENT:  Negative for congestion and hearing loss.   Eyes:  Negative for visual disturbance.  Respiratory:  Negative for cough, chest tightness, shortness of breath and wheezing.   Cardiovascular:  Negative for  chest pain, palpitations and leg swelling.  Gastrointestinal:  Negative for abdominal pain, constipation, diarrhea, nausea and vomiting.  Genitourinary:  Negative for dysuria, frequency and hematuria.  Musculoskeletal:  Negative for arthralgias and neck pain.  Skin:  Negative for rash.  Neurological:  Negative for dizziness, weakness, light-headedness, numbness and headaches.  Hematological:  Negative for adenopathy.  Psychiatric/Behavioral:  Negative for behavioral problems, dysphoric mood and sleep disturbance.    Per HPI unless specifically indicated above     Objective:    BP 118/70 (BP Location: Left Arm, Patient Position: Sitting, Cuff Size: Normal)   Pulse 88   Ht 5' 9 (1.753 m)   Wt 188 lb (85.3 kg)   SpO2 97%   BMI 27.76 kg/m   Wt Readings from Last 3 Encounters:  01/07/24 188 lb (85.3 kg)  12/24/22 173 lb (78.5 kg)  11/06/21 153 lb 12.8 oz (69.8 kg)    Physical Exam Vitals and nursing note reviewed.  Constitutional:      General: He is not in acute distress.    Appearance: He is well-developed. He is not diaphoretic.     Comments: Well-appearing, comfortable, cooperative  HENT:     Head: Normocephalic and atraumatic.  Eyes:     General:        Right eye: No discharge.        Left eye: No discharge.     Conjunctiva/sclera: Conjunctivae normal.     Pupils: Pupils are equal, round, and reactive to light.  Neck:     Thyroid: No thyromegaly.     Vascular: No carotid bruit.  Cardiovascular:     Rate and Rhythm: Normal rate and regular rhythm.     Pulses: Normal  pulses.     Heart sounds: Normal heart sounds. No murmur heard. Pulmonary:     Effort: Pulmonary effort is normal. No respiratory distress.     Breath sounds: Normal breath sounds. No wheezing or rales.  Abdominal:     General: Bowel sounds are normal. There is no distension.     Palpations: Abdomen is soft. There is no mass.     Tenderness: There is no abdominal tenderness.  Musculoskeletal:        General: No tenderness. Normal range of motion.     Cervical back: Normal range of motion and neck supple.     Comments: Upper / Lower Extremities: - Normal muscle tone, strength bilateral upper extremities 5/5, lower extremities 5/5  Lymphadenopathy:     Cervical: No cervical adenopathy.  Skin:    General: Skin is warm and dry.     Findings: No erythema or rash.  Neurological:     Mental Status: He is alert and oriented to person, place, and time.     Comments: Distal sensation intact to light touch all extremities  Psychiatric:        Mood and Affect: Mood normal.        Behavior: Behavior normal.        Thought Content: Thought content normal.     Comments: Well groomed, good eye contact, normal speech and thoughts     Results for orders placed or performed in visit on 01/07/24  TSH   Collection Time: 01/07/24  8:49 AM  Result Value Ref Range   TSH 5.64 (H) 0.40 - 4.50 mIU/L  Lipid panel   Collection Time: 01/07/24  8:49 AM  Result Value Ref Range   Cholesterol 273 (H) <200 mg/dL   HDL 63 > OR =  40 mg/dL   Triglycerides 805 (H) <150 mg/dL   LDL Cholesterol (Calc) 175 (H) mg/dL (calc)   Total CHOL/HDL Ratio 4.3 <5.0 (calc)   Non-HDL Cholesterol (Calc) 210 (H) <130 mg/dL (calc)  CBC with Differential/Platelet   Collection Time: 01/07/24  8:49 AM  Result Value Ref Range   WBC 5.6 3.8 - 10.8 Thousand/uL   RBC 5.42 4.20 - 5.80 Million/uL   Hemoglobin 16.4 13.2 - 17.1 g/dL   HCT 50.3 61.4 - 49.9 %   MCV 91.5 80.0 - 100.0 fL   MCH 30.3 27.0 - 33.0 pg   MCHC 33.1 32.0 -  36.0 g/dL   RDW 87.2 88.9 - 84.9 %   Platelets 203 140 - 400 Thousand/uL   MPV 10.5 7.5 - 12.5 fL   Neutro Abs 4,273 1,500 - 7,800 cells/uL   Absolute Lymphocytes 857 850 - 3,900 cells/uL   Absolute Monocytes 342 200 - 950 cells/uL   Eosinophils Absolute 67 15 - 500 cells/uL   Basophils Absolute 62 0 - 200 cells/uL   Neutrophils Relative % 76.3 %   Total Lymphocyte 15.3 %   Monocytes Relative 6.1 %   Eosinophils Relative 1.2 %   Basophils Relative 1.1 %  Comprehensive metabolic panel with GFR   Collection Time: 01/07/24  8:49 AM  Result Value Ref Range   Glucose, Bld 93 65 - 99 mg/dL   BUN 15 7 - 25 mg/dL   Creat 9.13 9.39 - 8.73 mg/dL   eGFR 880 > OR = 60 fO/fpw/8.26f7   BUN/Creatinine Ratio SEE NOTE: 6 - 22 (calc)   Sodium 140 135 - 146 mmol/L   Potassium 4.4 3.5 - 5.3 mmol/L   Chloride 102 98 - 110 mmol/L   CO2 29 20 - 32 mmol/L   Calcium 10.2 8.6 - 10.3 mg/dL   Total Protein 7.7 6.1 - 8.1 g/dL   Albumin 5.2 (H) 3.6 - 5.1 g/dL   Globulin 2.5 1.9 - 3.7 g/dL (calc)   AG Ratio 2.1 1.0 - 2.5 (calc)   Total Bilirubin 0.7 0.2 - 1.2 mg/dL   Alkaline phosphatase (APISO) 54 36 - 130 U/L   AST 35 10 - 40 U/L   ALT 63 (H) 9 - 46 U/L  T4, free   Collection Time: 01/07/24  8:49 AM  Result Value Ref Range   Free T4 1.3 0.8 - 1.8 ng/dL      Assessment & Plan:   Problem List Items Addressed This Visit     Hypertriglyceridemia   Relevant Orders   TSH (Completed)   Lipid panel (Completed)   Comprehensive metabolic panel with GFR (Completed)   T4, free (Completed)   Other Visit Diagnoses       Annual physical exam    -  Primary   Relevant Orders   TSH (Completed)   Lipid panel (Completed)   Hemoglobin A1c   CBC with Differential/Platelet (Completed)   Comprehensive metabolic panel with GFR (Completed)     Need for diphtheria-tetanus-pertussis (Tdap) vaccine       Relevant Orders   Tdap vaccine greater than or equal to 7yo IM (Completed)     Impaired fasting glucose        Relevant Orders   Hemoglobin A1c     Elevated TSH       Relevant Orders   TSH (Completed)   T4, free (Completed)     Gastroesophageal reflux disease without esophagitis       Relevant Medications   omeprazole  (  PRILOSEC) 20 MG capsule     Acute non-recurrent frontal sinusitis       Relevant Medications   fluticasone  (FLONASE ) 50 MCG/ACT nasal spray        Updated Health Maintenance information Reviewed recent lab results with patient Encouraged improvement to lifestyle with diet and exercise Goal of weight loss  Elevated TSH, possible subclinical hypothyroidism TSH at 5.4, no significant symptoms. Possible subclinical hypothyroidism considered. No immediate treatment required unless consistent low thyroid function is confirmed. - Order TSH and additional thyroid function tests.Free T4  Hyperlipidemia Cholesterol, LDL, and triglycerides increasing over 4-5 years. Current fasting blood work to assess levels and trends. - Order lipid panel as part of blood work.  Weight gain Weight gain attributed to dietary habits. Regular physical activity maintained. Discussed dietary modifications. - Encourage dietary modifications.  Gastroesophageal reflux disease (GERD) GERD managed with omeprazole . - Renew omeprazole  prescription.  Allergic rhinitis Allergic rhinitis managed with Flonase . - Renew Flonase  prescription.        Orders Placed This Encounter  Procedures  . Tdap vaccine greater than or equal to 7yo IM  . TSH  . Lipid panel    Has the patient fasted?:   Yes  . Hemoglobin A1c  . CBC with Differential/Platelet  . Comprehensive metabolic panel with GFR    Has the patient fasted?:   Yes  . T4, free    Meds ordered this encounter  Medications  . omeprazole  (PRILOSEC) 20 MG capsule    Sig: Take 1 capsule (20 mg total) by mouth daily before breakfast.    Dispense:  90 capsule    Refill:  3  . fluticasone  (FLONASE ) 50 MCG/ACT nasal spray    Sig: Place 2 sprays  into both nostrils daily. Use for 4-6 weeks then stop and use seasonally or as needed.    Dispense:  48 mL    Refill:  3     Follow up plan: Return in about 1 year (around 01/06/2025) for 1 year Annual Physical fasting lab after.  Marsa Officer, DO [facility] 01/07/2024, 8:10 AM

## 2024-01-08 LAB — CBC WITH DIFFERENTIAL/PLATELET
Absolute Lymphocytes: 857 {cells}/uL (ref 850–3900)
Absolute Monocytes: 342 {cells}/uL (ref 200–950)
Basophils Absolute: 62 {cells}/uL (ref 0–200)
Basophils Relative: 1.1 %
Eosinophils Absolute: 67 {cells}/uL (ref 15–500)
Eosinophils Relative: 1.2 %
HCT: 49.6 % (ref 38.5–50.0)
Hemoglobin: 16.4 g/dL (ref 13.2–17.1)
MCH: 30.3 pg (ref 27.0–33.0)
MCHC: 33.1 g/dL (ref 32.0–36.0)
MCV: 91.5 fL (ref 80.0–100.0)
MPV: 10.5 fL (ref 7.5–12.5)
Monocytes Relative: 6.1 %
Neutro Abs: 4273 {cells}/uL (ref 1500–7800)
Neutrophils Relative %: 76.3 %
Platelets: 203 Thousand/uL (ref 140–400)
RBC: 5.42 Million/uL (ref 4.20–5.80)
RDW: 12.7 % (ref 11.0–15.0)
Total Lymphocyte: 15.3 %
WBC: 5.6 Thousand/uL (ref 3.8–10.8)

## 2024-01-08 LAB — TSH: TSH: 5.64 m[IU]/L — ABNORMAL HIGH (ref 0.40–4.50)

## 2024-01-08 LAB — COMPREHENSIVE METABOLIC PANEL WITH GFR
AG Ratio: 2.1 (calc) (ref 1.0–2.5)
ALT: 63 U/L — ABNORMAL HIGH (ref 9–46)
AST: 35 U/L (ref 10–40)
Albumin: 5.2 g/dL — ABNORMAL HIGH (ref 3.6–5.1)
Alkaline phosphatase (APISO): 54 U/L (ref 36–130)
BUN: 15 mg/dL (ref 7–25)
CO2: 29 mmol/L (ref 20–32)
Calcium: 10.2 mg/dL (ref 8.6–10.3)
Chloride: 102 mmol/L (ref 98–110)
Creat: 0.86 mg/dL (ref 0.60–1.26)
Globulin: 2.5 g/dL (ref 1.9–3.7)
Glucose, Bld: 93 mg/dL (ref 65–99)
Potassium: 4.4 mmol/L (ref 3.5–5.3)
Sodium: 140 mmol/L (ref 135–146)
Total Bilirubin: 0.7 mg/dL (ref 0.2–1.2)
Total Protein: 7.7 g/dL (ref 6.1–8.1)
eGFR: 119 mL/min/1.73m2 (ref >=60)

## 2024-01-08 LAB — HEMOGLOBIN A1C
Hgb A1c MFr Bld: 5.2 % (ref <5.7)
Mean Plasma Glucose: 103 mg/dL
eAG (mmol/L): 5.7 mmol/L

## 2024-01-08 LAB — LIPID PANEL
Cholesterol: 273 mg/dL — ABNORMAL HIGH (ref <200)
HDL: 63 mg/dL (ref >=40)
LDL Cholesterol (Calc): 175 mg/dL — ABNORMAL HIGH
Non-HDL Cholesterol (Calc): 210 mg/dL — ABNORMAL HIGH (ref <130)
Total CHOL/HDL Ratio: 4.3 (calc) (ref <5.0)
Triglycerides: 194 mg/dL — ABNORMAL HIGH (ref <150)

## 2024-01-08 LAB — T4, FREE: Free T4: 1.3 ng/dL (ref 0.8–1.8)

## 2024-01-17 ENCOUNTER — Ambulatory Visit: Payer: Self-pay | Admitting: Family Medicine

## 2025-01-13 ENCOUNTER — Encounter: Payer: Self-pay | Admitting: Family Medicine
